# Patient Record
Sex: Male | Born: 1965 | Race: White | Hispanic: No | Smoking: Smoker, current status unknown
Health system: Southern US, Community
[De-identification: ages and names within clinical notes are randomized; demographics above are authoritative.]

## PROBLEM LIST (undated history)

## (undated) DIAGNOSIS — Z9079 Acquired absence of other genital organ(s): Secondary | ICD-10-CM

## (undated) DIAGNOSIS — I2510 Atherosclerotic heart disease of native coronary artery without angina pectoris: Secondary | ICD-10-CM

## (undated) DIAGNOSIS — N189 Chronic kidney disease, unspecified: Secondary | ICD-10-CM

## (undated) DIAGNOSIS — I10 Essential (primary) hypertension: Secondary | ICD-10-CM

## (undated) DIAGNOSIS — I700 Atherosclerosis of aorta: Secondary | ICD-10-CM

## (undated) DIAGNOSIS — C61 Malignant neoplasm of prostate: Secondary | ICD-10-CM

## (undated) DIAGNOSIS — Z8546 Personal history of malignant neoplasm of prostate: Secondary | ICD-10-CM

## (undated) DIAGNOSIS — C801 Malignant (primary) neoplasm, unspecified: Secondary | ICD-10-CM

## (undated) DIAGNOSIS — E291 Testicular hypofunction: Secondary | ICD-10-CM

## (undated) DIAGNOSIS — E785 Hyperlipidemia, unspecified: Secondary | ICD-10-CM

## (undated) DIAGNOSIS — G5600 Carpal tunnel syndrome, unspecified upper limb: Secondary | ICD-10-CM

## (undated) HISTORY — DX: Hyperlipidemia, unspecified: E78.5

## (undated) HISTORY — DX: Chronic kidney disease, unspecified: N18.9

## (undated) HISTORY — PX: FRACTURE SURGERY: SHX138

## (undated) HISTORY — DX: Personal history of malignant neoplasm of prostate: Z85.46

## (undated) HISTORY — DX: Atherosclerosis of aorta: I70.0

## (undated) HISTORY — DX: Carpal tunnel syndrome, unspecified upper limb: G56.00

## (undated) HISTORY — DX: Atherosclerotic heart disease of native coronary artery without angina pectoris: I25.10

## (undated) HISTORY — DX: Acquired absence of other genital organ(s): Z90.79

## (undated) HISTORY — DX: Malignant neoplasm of prostate: C61

## (undated) HISTORY — DX: Testicular hypofunction: E29.1

## (undated) HISTORY — DX: Essential (primary) hypertension: I10

## (undated) HISTORY — PX: VASECTOMY: SHX75

---

## 2008-12-31 ENCOUNTER — Encounter: Admission: RE | Admit: 2008-12-31 | Discharge: 2008-12-31 | Payer: Self-pay | Admitting: Orthopedic Surgery

## 2011-12-14 ENCOUNTER — Ambulatory Visit (INDEPENDENT_AMBULATORY_CARE_PROVIDER_SITE_OTHER): Payer: Managed Care, Other (non HMO) | Admitting: Emergency Medicine

## 2011-12-14 VITALS — BP 126/76 | HR 56 | Temp 97.6°F | Resp 16 | Ht 71.5 in | Wt 218.0 lb

## 2011-12-14 DIAGNOSIS — N400 Enlarged prostate without lower urinary tract symptoms: Secondary | ICD-10-CM

## 2011-12-14 DIAGNOSIS — N402 Nodular prostate without lower urinary tract symptoms: Secondary | ICD-10-CM

## 2011-12-14 DIAGNOSIS — Z0000 Encounter for general adult medical examination without abnormal findings: Secondary | ICD-10-CM

## 2011-12-14 LAB — POCT CBC
Granulocyte percent: 63.5 %G (ref 37–80)
MCH, POC: 30.2 pg (ref 27–31.2)
POC LYMPH PERCENT: 30.4 %L (ref 10–50)
RBC: 5.13 M/uL (ref 4.69–6.13)
RDW, POC: 14 %

## 2011-12-14 LAB — COMPREHENSIVE METABOLIC PANEL
ALT: 32 U/L (ref 0–53)
Albumin: 4.6 g/dL (ref 3.5–5.2)
CO2: 27 mEq/L (ref 19–32)
Chloride: 104 mEq/L (ref 96–112)
Creat: 1.11 mg/dL (ref 0.50–1.35)
Glucose, Bld: 89 mg/dL (ref 70–99)
Potassium: 4.3 mEq/L (ref 3.5–5.3)
Sodium: 140 mEq/L (ref 135–145)
Total Protein: 7.4 g/dL (ref 6.0–8.3)

## 2011-12-14 LAB — POCT UA - MICROSCOPIC ONLY
Casts, Ur, LPF, POC: NEGATIVE
Crystals, Ur, HPF, POC: NEGATIVE
RBC, urine, microscopic: NEGATIVE
WBC, Ur, HPF, POC: NEGATIVE
Yeast, UA: NEGATIVE

## 2011-12-14 LAB — POCT URINALYSIS DIPSTICK
Blood, UA: NEGATIVE
Nitrite, UA: NEGATIVE
Spec Grav, UA: 1.015
Urobilinogen, UA: 0.2
pH, UA: 7

## 2011-12-14 LAB — LIPID PANEL
Triglycerides: 62 mg/dL (ref <150)
VLDL: 12 mg/dL (ref 0–40)

## 2011-12-14 LAB — TSH: TSH: 1.202 u[IU]/mL (ref 0.350–4.500)

## 2011-12-14 NOTE — Progress Notes (Signed)
@[email or web address] @  Patient ID: [name] MRN: [id number], DOB: 1965-04-03 46 y.o. Date of Encounter: 12/14/2011, 9:00 AM  Primary Physician: No primary provider on file.  Chief Complaint: Physical (CPE)  HPI: 46 y.o. y/o male with history noted below here for CPE.  Doing well. No issues/complaints.  Review of Systems:  Consitutional: No fever, chills, fatigue, night sweats, lymphadenopathy, or weight changes. Eyes: No visual changes, eye redness, or discharge. ENT/Mouth: Ears: No otalgia, tinnitus, hearing loss, discharge. Nose: No congestion, rhinorrhea, or epistaxis. Pt does report Sinus Pressure. Throat: No sore throat, post nasal drip, or teeth pain. Cardiovascular: No CP, palpitations, diaphoresis, DOE, edema, orthopnea, PND. Respiratory: No cough, hemoptysis, SOB, or wheezing. Gastrointestinal: No anorexia, dysphagia, reflux, pain, nausea, vomiting, hematemesis, diarrhea, constipation, BRBPR, or melena. Genitourinary: No dysuria, frequency, urgency, hematuria, incontinence, nocturia, decreased urinary stream, discharge, impotence, or testicular pain/masses. Musculoskeletal: No decreased ROM, myalgias, stiffness, joint swelling, or weakness. Skin: No rash, erythema, lesion changes, pain, warmth, jaundice, or pruritis. Neurological: No headache, dizziness, syncope, seizures, tremors, memory loss, coordination problems, or paresthesias. Psychological: No anxiety, depression, hallucinations, SI/HI. Endocrine: No fatigue, polydipsia, polyphagia, polyuria, or known diabetes. All other systems were reviewed and are otherwise negative.  No past medical history on file.   No past surgical history on file.  Home Meds:  Prior to Admission medications   Medication Sig Start Date End Date Taking? Authorizing Provider  Ascorbic Acid (VITAMIN C PO) Take by mouth.   Yes Historical Provider, MD  Cyanocobalamin (VITAMIN B-12 PO) Take by mouth.   Yes Historical Provider, MD  Multiple Vitamin  (MULTIVITAMIN) tablet Take 1 tablet by mouth daily.   Yes Historical Provider, MD    Allergies: No Known Allergies  History   Social History  . Marital Status: Married    Spouse Name: N/A    Number of Children: N/A  . Years of Education: N/A   Occupational History  . Not on file.   Social History Main Topics  . Smoking status: Former Smoker    Types: Cigars  . Smokeless tobacco: Not on file  . Alcohol Use: Not on file  . Drug Use: Not on file  . Sexually Active: Not on file   Other Topics Concern  . Not on file   Social History Narrative  . No narrative on file    No family history on file.  Physical Exam:  Blood pressure 126/76, pulse 56, temperature 97.6 F (36.4 C), temperature source Oral, resp. rate 16, height 5' 11.5" (1.816 m), weight 218 lb (98.884 kg), SpO2 99.00%.  General: Well developed, well nourished, in no acute distress. HEENT: Normocephalic, atraumatic. Conjunctiva pink, sclera non-icteric. Pupils 2 mm constricting to 1 mm, round, regular, and equally reactive to light and accomodation. EOMI. Internal auditory canal clear. TMs with good cone of light and without pathology. Nasal mucosa pink. Nares are without discharge. No sinus tenderness. Oral mucosa pink. Dentition . Pharynx without exudate.   Neck: Supple. Trachea midline. No thyromegaly. Full ROM. No lymphadenopathy. Lungs: Clear to auscultation bilaterally without wheezes, rales, or rhonchi. Breathing is of normal effort and unlabored. Cardiovascular: RRR with S1 S2. No murmurs, rubs, or gallops appreciated. Distal pulses 2+ symmetrically. No carotid or abdominal bruits.  Abdomen: Soft, non-tender, non-distended with normoactive bowel sounds. No hepatosplenomegaly or masses. No rebound/guarding. No CVA tenderness. Without hernias.  Rectal: No external hemorrhoids or fissures. Rectal vault without masses. The left lobe of the prostate is firm and hard .   Genitourinary:  circumcised male. No  penile lesions. Testes descended bilaterally, and smooth without tenderness or masses.  Musculoskeletal: Full range of motion and 5/5 strength throughout. Without swelling, atrophy, tenderness, crepitus, or warmth. Extremities without clubbing, cyanosis, or edema. Calves supple. Skin: Warm and moist without erythema, ecchymosis, wounds, or rash. Neuro: A+Ox3. CN II-XII grossly intact. Moves all extremities spontaneously. Full sensation throughout. Normal gait. DTR 2+ throughout upper and lower extremities. Finger to nose intact. Psych:  Responds to questions appropriately with a normal affect.   Studies: CBC, CMET, Lipid, PSA, TSH,   all pending. Patient is in good health. We'll get further followup of his abnormal prostate exam      Assessment/Plan:  46 y.o. y/o   male here for CPE the only abnormal finding is an enlarged firm area left lobe laterally of the prostate gland PSA was done with get urological evaluation of this area  -  Signed, Earl Lites, MD 12/14/2011 9:00 AM

## 2011-12-14 NOTE — Progress Notes (Deleted)
  Subjective:    Patient ID: [name], male    DOB: 17-Feb-1966, 46 y.o.   MRN: [id number]  HPI    Review of Systems     Objective:   Physical Exam        Assessment & Plan:

## 2012-03-14 ENCOUNTER — Other Ambulatory Visit: Payer: Self-pay | Admitting: Urology

## 2012-03-21 ENCOUNTER — Encounter (HOSPITAL_COMMUNITY): Payer: Self-pay | Admitting: Pharmacy Technician

## 2012-03-22 ENCOUNTER — Encounter (HOSPITAL_COMMUNITY)
Admission: RE | Admit: 2012-03-22 | Discharge: 2012-03-22 | Disposition: A | Discharge status: Home / Self Care | Payer: Managed Care, Other (non HMO) | Source: Ambulatory Visit | Attending: Urology | Admitting: Urology

## 2012-03-22 ENCOUNTER — Ambulatory Visit (HOSPITAL_COMMUNITY)
Admission: RE | Admit: 2012-03-22 | Discharge: 2012-03-22 | Disposition: A | Discharge status: Home / Self Care | Payer: Managed Care, Other (non HMO) | Source: Ambulatory Visit | Attending: Urology | Admitting: Urology

## 2012-03-22 ENCOUNTER — Encounter (HOSPITAL_COMMUNITY): Payer: Self-pay

## 2012-03-22 DIAGNOSIS — Z01812 Encounter for preprocedural laboratory examination: Secondary | ICD-10-CM | POA: Insufficient documentation

## 2012-03-22 DIAGNOSIS — C61 Malignant neoplasm of prostate: Secondary | ICD-10-CM | POA: Insufficient documentation

## 2012-03-22 HISTORY — DX: Malignant (primary) neoplasm, unspecified: C80.1

## 2012-03-22 LAB — SURGICAL PCR SCREEN
MRSA, PCR: NEGATIVE
Staphylococcus aureus: POSITIVE — AB

## 2012-03-22 LAB — CBC
HCT: 43.2 % (ref 39.0–52.0)
Hemoglobin: 14.7 g/dL (ref 13.0–17.0)
MCH: 30.6 pg (ref 26.0–34.0)
MCHC: 34 g/dL (ref 30.0–36.0)
RBC: 4.81 MIL/uL (ref 4.22–5.81)

## 2012-03-22 LAB — BASIC METABOLIC PANEL
CO2: 26 mEq/L (ref 19–32)
GFR calc Af Amer: >90 mL/min (ref >90)
Glucose, Bld: 95 mg/dL (ref 70–99)
Potassium: 3.7 mEq/L (ref 3.5–5.1)

## 2012-03-22 NOTE — Pre-Procedure Instructions (Addendum)
03-22-12 EKG 10'13/ CXR done today. 03-25-12 0920 Pt. Notified of Positive Staph aureus PCR screen- will use Mupirocin as directed.W. Kennon Portela

## 2012-03-22 NOTE — Patient Instructions (Addendum)
20 [name]  03/22/2012   Your procedure is scheduled on:2-10   -2014  Report to [facility] at    0515    AM. Call this number if you have problems the morning of surgery: [phone]  Or Presurgical Testing [phone]([place])   Remember: Follow any bowel prep instructions per MD office.(Follow Clear Liquid diet x 24 hours day before and bowel prep instructions-drink plentiful). For Cpap use: Bring mask and tubing only.   Do not eat food:After Midnight.      Take these medicines the morning of surgery with A SIP OF WATER:none    Do not wear jewelry, make-up or nail polish.  Do not wear lotions, powders, or perfumes. You may wear deodorant.  Do not shave 12 hours prior to first CHG shower(legs and under arms).(face and neck okay.)  Do not bring valuables to the hospital.  Contacts, dentures or bridgework,body piercing,  may not be worn into surgery.  Leave suitcase in the car. After surgery it may be brought to your room.  For patients admitted to the hospital, checkout time is 11:00 AM the day of discharge.   Patients discharged the day of surgery will not be allowed to drive home. Must have responsible person with you x 24 hours once discharged.  Name and phone number of your driver: [name] [phone] cell  Special Instructions: CHG Shower Use Special Wash: see special instructions.(avoid face and genitals)   Please read over the following fact sheets that you were given: MRSA Information, Blood Transfusion fact sheet, Incentive Spirometry Instruction.    Failure to follow these instructions may result in Cancellation of your surgery.   Patient signature_______________________________________________________

## 2012-04-02 ENCOUNTER — Other Ambulatory Visit: Payer: Self-pay | Admitting: Urology

## 2012-04-02 DIAGNOSIS — C61 Malignant neoplasm of prostate: Secondary | ICD-10-CM

## 2012-04-09 ENCOUNTER — Ambulatory Visit (HOSPITAL_COMMUNITY): Payer: Managed Care, Other (non HMO)

## 2012-04-15 ENCOUNTER — Observation Stay (HOSPITAL_COMMUNITY)
Admission: RE | Admit: 2012-04-15 | Discharge: 2012-04-16 | Disposition: A | Discharge status: Home / Self Care | Payer: Managed Care, Other (non HMO) | Source: Ambulatory Visit | Attending: Urology | Admitting: Urology

## 2012-04-15 ENCOUNTER — Ambulatory Visit (HOSPITAL_COMMUNITY): Payer: Managed Care, Other (non HMO) | Admitting: Certified Registered Nurse Anesthetist

## 2012-04-15 ENCOUNTER — Encounter (HOSPITAL_COMMUNITY)
Admission: RE | Disposition: A | Discharge status: Home / Self Care | Payer: Self-pay | Source: Ambulatory Visit | Attending: Urology

## 2012-04-15 ENCOUNTER — Encounter (HOSPITAL_COMMUNITY): Payer: Self-pay | Admitting: *Deleted

## 2012-04-15 ENCOUNTER — Encounter (HOSPITAL_COMMUNITY): Payer: Self-pay | Admitting: Certified Registered Nurse Anesthetist

## 2012-04-15 DIAGNOSIS — C61 Malignant neoplasm of prostate: Principal | ICD-10-CM | POA: Insufficient documentation

## 2012-04-15 HISTORY — PX: ROBOT ASSISTED LAPAROSCOPIC RADICAL PROSTATECTOMY: SHX5141

## 2012-04-15 HISTORY — PX: LYMPHADENECTOMY: SHX5960

## 2012-04-15 LAB — TYPE AND SCREEN: ABO/RH(D): A POS

## 2012-04-15 LAB — HEMOGLOBIN AND HEMATOCRIT, BLOOD: Hemoglobin: 13.7 g/dL (ref 13.0–17.0)

## 2012-04-15 LAB — ABO/RH: ABO/RH(D): A POS

## 2012-04-15 SURGERY — ROBOTIC ASSISTED LAPAROSCOPIC RADICAL PROSTATECTOMY LEVEL 2
Anesthesia: General | Wound class: Clean Contaminated

## 2012-04-15 MED ORDER — DOCUSATE SODIUM 100 MG PO CAPS
100.0000 mg | ORAL_CAPSULE | Freq: Two times a day (BID) | ORAL | Status: DC
  Administered 2012-04-15 – 2012-04-16 (×3): 100 mg via ORAL
  Filled 2012-04-15 (×4): qty 1

## 2012-04-15 MED ORDER — SODIUM CHLORIDE 0.9 % IV BOLUS (SEPSIS)
1000.0000 mL | Freq: Once | INTRAVENOUS | Status: AC
  Administered 2012-04-15: 1000 mL via INTRAVENOUS

## 2012-04-15 MED ORDER — LACTATED RINGERS IV SOLN
INTRAVENOUS | Status: DC | PRN
  Administered 2012-04-15: 11:00:00
  Administered 2012-04-15: 07:00:00 via INTRAVENOUS

## 2012-04-15 MED ORDER — HYDROMORPHONE HCL PF 1 MG/ML IJ SOLN
INTRAMUSCULAR | Status: AC
  Filled 2012-04-15: qty 1

## 2012-04-15 MED ORDER — ACETAMINOPHEN 325 MG PO TABS: 650.0000 mg | ORAL_TABLET | ORAL | Status: DC | PRN

## 2012-04-15 MED ORDER — BUPIVACAINE-EPINEPHRINE PF 0.25-1:200000 % IJ SOLN
INTRAMUSCULAR | Status: AC
  Filled 2012-04-15: qty 90

## 2012-04-15 MED ORDER — NEOSTIGMINE METHYLSULFATE 1 MG/ML IJ SOLN
INTRAMUSCULAR | Status: DC | PRN
  Administered 2012-04-15: 5 mg via INTRAVENOUS

## 2012-04-15 MED ORDER — LIDOCAINE HCL (CARDIAC) 20 MG/ML IV SOLN
INTRAVENOUS | Status: DC | PRN
  Administered 2012-04-15: 100 mg via INTRAVENOUS

## 2012-04-15 MED ORDER — HYDROMORPHONE HCL PF 1 MG/ML IJ SOLN
0.2500 mg | INTRAMUSCULAR | Status: DC | PRN
  Administered 2012-04-15 (×4): 0.5 mg via INTRAVENOUS

## 2012-04-15 MED ORDER — INDIGOTINDISULFONATE SODIUM 8 MG/ML IJ SOLN
INTRAMUSCULAR | Status: DC | PRN
  Administered 2012-04-15 (×2): 5 mL via INTRAVENOUS

## 2012-04-15 MED ORDER — ACETAMINOPHEN 10 MG/ML IV SOLN
INTRAVENOUS | Status: DC | PRN
  Administered 2012-04-15: 1000 mg via INTRAVENOUS

## 2012-04-15 MED ORDER — MIDAZOLAM HCL 5 MG/5ML IJ SOLN
INTRAMUSCULAR | Status: DC | PRN
  Administered 2012-04-15: 2 mg via INTRAVENOUS

## 2012-04-15 MED ORDER — OXYCODONE HCL 5 MG PO TABS: 5.0000 mg | ORAL_TABLET | Freq: Once | ORAL | Status: DC | PRN

## 2012-04-15 MED ORDER — FENTANYL CITRATE 0.05 MG/ML IJ SOLN
INTRAMUSCULAR | Status: DC | PRN
  Administered 2012-04-15 (×5): 50 ug via INTRAVENOUS

## 2012-04-15 MED ORDER — DIPHENHYDRAMINE HCL 12.5 MG/5ML PO ELIX: 12.5000 mg | ORAL_SOLUTION | Freq: Four times a day (QID) | ORAL | Status: DC | PRN

## 2012-04-15 MED ORDER — PROMETHAZINE HCL 25 MG/ML IJ SOLN: 6.2500 mg | INTRAMUSCULAR | Status: DC | PRN

## 2012-04-15 MED ORDER — EPHEDRINE SULFATE 50 MG/ML IJ SOLN
INTRAMUSCULAR | Status: DC | PRN
  Administered 2012-04-15 (×2): 10 mg via INTRAVENOUS

## 2012-04-15 MED ORDER — KETOROLAC TROMETHAMINE 15 MG/ML IJ SOLN
15.0000 mg | Freq: Four times a day (QID) | INTRAMUSCULAR | Status: DC
  Administered 2012-04-15 – 2012-04-16 (×4): 15 mg via INTRAVENOUS
  Filled 2012-04-15 (×5): qty 1

## 2012-04-15 MED ORDER — CIPROFLOXACIN HCL 500 MG PO TABS: 500.0000 mg | ORAL_TABLET | Freq: Two times a day (BID) | ORAL | Status: DC

## 2012-04-15 MED ORDER — CEFAZOLIN SODIUM 1-5 GM-% IV SOLN
1.0000 g | Freq: Three times a day (TID) | INTRAVENOUS | Status: AC
Start: 2012-04-15 — End: 2012-04-15
  Administered 2012-04-15 (×2): 1 g via INTRAVENOUS
  Filled 2012-04-15 (×3): qty 50

## 2012-04-15 MED ORDER — PHENOL 1.4 % MT LIQD
1.0000 | OROMUCOSAL | Status: DC | PRN
  Filled 2012-04-15: qty 177

## 2012-04-15 MED ORDER — HYDROMORPHONE HCL PF 1 MG/ML IJ SOLN
INTRAMUSCULAR | Status: DC | PRN
  Administered 2012-04-15 (×3): 0.5 mg via INTRAVENOUS

## 2012-04-15 MED ORDER — OXYCODONE HCL 5 MG/5ML PO SOLN
5.0000 mg | Freq: Once | ORAL | Status: DC | PRN
  Filled 2012-04-15: qty 5

## 2012-04-15 MED ORDER — MENTHOL 3 MG MT LOZG
1.0000 | LOZENGE | OROMUCOSAL | Status: DC | PRN
  Filled 2012-04-15: qty 9

## 2012-04-15 MED ORDER — BELLADONNA ALKALOIDS-OPIUM 16.2-60 MG RE SUPP
RECTAL | Status: AC
  Filled 2012-04-15: qty 1

## 2012-04-15 MED ORDER — BUPIVACAINE-EPINEPHRINE 0.25% -1:200000 IJ SOLN
INTRAMUSCULAR | Status: DC | PRN
  Administered 2012-04-15: 30 mL

## 2012-04-15 MED ORDER — HYDROCODONE-ACETAMINOPHEN 5-325 MG PO TABS: 1.0000 | ORAL_TABLET | Freq: Four times a day (QID) | ORAL | Status: DC | PRN

## 2012-04-15 MED ORDER — MORPHINE SULFATE 2 MG/ML IJ SOLN
2.0000 mg | INTRAMUSCULAR | Status: DC | PRN
  Filled 2012-04-15: qty 1

## 2012-04-15 MED ORDER — ACETAMINOPHEN 10 MG/ML IV SOLN
INTRAVENOUS | Status: AC
  Filled 2012-04-15: qty 100

## 2012-04-15 MED ORDER — DEXAMETHASONE SODIUM PHOSPHATE 10 MG/ML IJ SOLN
INTRAMUSCULAR | Status: DC | PRN
  Administered 2012-04-15: 10 mg via INTRAVENOUS

## 2012-04-15 MED ORDER — LACTATED RINGERS IV SOLN
INTRAVENOUS | Status: DC | PRN
  Administered 2012-04-15: 08:00:00

## 2012-04-15 MED ORDER — DIPHENHYDRAMINE HCL 50 MG/ML IJ SOLN: 12.5000 mg | Freq: Four times a day (QID) | INTRAMUSCULAR | Status: DC | PRN

## 2012-04-15 MED ORDER — ONDANSETRON HCL 4 MG/2ML IJ SOLN
INTRAMUSCULAR | Status: DC | PRN
  Administered 2012-04-15: 4 mg via INTRAVENOUS

## 2012-04-15 MED ORDER — KCL IN DEXTROSE-NACL 20-5-0.45 MEQ/L-%-% IV SOLN
INTRAVENOUS | Status: DC
  Administered 2012-04-15: 15:00:00 via INTRAVENOUS
  Filled 2012-04-15 (×5): qty 1000

## 2012-04-15 MED ORDER — SUCCINYLCHOLINE CHLORIDE 20 MG/ML IJ SOLN
INTRAMUSCULAR | Status: DC | PRN
  Administered 2012-04-15: 100 mg via INTRAVENOUS

## 2012-04-15 MED ORDER — BELLADONNA ALKALOIDS-OPIUM 16.2-60 MG RE SUPP
1.0000 | Freq: Once | RECTAL | Status: AC
  Administered 2012-04-15: 1 via RECTAL

## 2012-04-15 MED ORDER — STERILE WATER FOR IRRIGATION IR SOLN
Status: DC | PRN
  Administered 2012-04-15: 3000 mL

## 2012-04-15 MED ORDER — HEPARIN SODIUM (PORCINE) 1000 UNIT/ML IJ SOLN
INTRAMUSCULAR | Status: AC
  Filled 2012-04-15: qty 1

## 2012-04-15 MED ORDER — SODIUM CHLORIDE 0.9 % IR SOLN
Status: DC | PRN
  Administered 2012-04-15: 1000 mL

## 2012-04-15 MED ORDER — MEPERIDINE HCL 50 MG/ML IJ SOLN: 6.2500 mg | INTRAMUSCULAR | Status: DC | PRN

## 2012-04-15 MED ORDER — PROPOFOL 10 MG/ML IV BOLUS
INTRAVENOUS | Status: DC | PRN
  Administered 2012-04-15: 200 mg via INTRAVENOUS

## 2012-04-15 MED ORDER — CEFAZOLIN SODIUM-DEXTROSE 2-3 GM-% IV SOLR
2.0000 g | INTRAVENOUS | Status: AC
  Administered 2012-04-15: 2 g via INTRAVENOUS

## 2012-04-15 MED ORDER — ACETAMINOPHEN 10 MG/ML IV SOLN: 1000.0000 mg | Freq: Once | INTRAVENOUS | Status: DC | PRN

## 2012-04-15 MED ORDER — ROCURONIUM BROMIDE 100 MG/10ML IV SOLN
INTRAVENOUS | Status: DC | PRN
  Administered 2012-04-15: 10 mg via INTRAVENOUS
  Administered 2012-04-15: 40 mg via INTRAVENOUS
  Administered 2012-04-15 (×3): 10 mg via INTRAVENOUS

## 2012-04-15 MED ORDER — GLYCOPYRROLATE 0.2 MG/ML IJ SOLN
INTRAMUSCULAR | Status: DC | PRN
  Administered 2012-04-15: 0.6 mg via INTRAVENOUS

## 2012-04-15 MED ORDER — CEFAZOLIN SODIUM-DEXTROSE 2-3 GM-% IV SOLR
INTRAVENOUS | Status: AC
  Filled 2012-04-15: qty 50

## 2012-04-15 SURGICAL SUPPLY — 45 items
CANISTER SUCTION 2500CC (MISCELLANEOUS) ×3 IMPLANT
CATH FOLEY 2WAY SLVR 18FR 30CC (CATHETERS) ×3 IMPLANT
CATH ROBINSON RED A/P 16FR (CATHETERS) ×3 IMPLANT
CATH ROBINSON RED A/P 8FR (CATHETERS) ×3 IMPLANT
CATH TIEMANN FOLEY 18FR 5CC (CATHETERS) ×3 IMPLANT
CHLORAPREP W/TINT 26ML (MISCELLANEOUS) ×3 IMPLANT
CLIP LIGATING HEM O LOK PURPLE (MISCELLANEOUS) ×6 IMPLANT
CLOTH BEACON ORANGE TIMEOUT ST (SAFETY) ×3 IMPLANT
CORD HIGH FREQUENCY UNIPOLAR (ELECTROSURGICAL) ×3 IMPLANT
COVER SURGICAL LIGHT HANDLE (MISCELLANEOUS) ×3 IMPLANT
COVER TIP SHEARS 8 DVNC (MISCELLANEOUS) ×2 IMPLANT
COVER TIP SHEARS 8MM DA VINCI (MISCELLANEOUS) ×1
CUTTER ECHEON FLEX ENDO 45 340 (ENDOMECHANICALS) ×3 IMPLANT
DECANTER SPIKE VIAL GLASS SM (MISCELLANEOUS) IMPLANT
DRAPE SURG IRRIG POUCH 19X23 (DRAPES) ×3 IMPLANT
DRSG TEGADERM 2-3/8X2-3/4 SM (GAUZE/BANDAGES/DRESSINGS) ×9 IMPLANT
DRSG TEGADERM 4X4.75 (GAUZE/BANDAGES/DRESSINGS) ×6 IMPLANT
DRSG TEGADERM 6X8 (GAUZE/BANDAGES/DRESSINGS) IMPLANT
ELECT REM PT RETURN 9FT ADLT (ELECTROSURGICAL) ×3
ELECTRODE REM PT RTRN 9FT ADLT (ELECTROSURGICAL) ×2 IMPLANT
GAUZE SPONGE 2X2 8PLY STRL LF (GAUZE/BANDAGES/DRESSINGS) IMPLANT
GLOVE BIO SURGEON STRL SZ 6.5 (GLOVE) ×3 IMPLANT
GLOVE BIOGEL M STRL SZ7.5 (GLOVE) ×18 IMPLANT
GOWN STRL NON-REIN LRG LVL3 (GOWN DISPOSABLE) ×6 IMPLANT
GOWN STRL REIN XL XLG (GOWN DISPOSABLE) ×6 IMPLANT
HOLDER FOLEY CATH W/STRAP (MISCELLANEOUS) ×3 IMPLANT
IV LACTATED RINGERS 1000ML (IV SOLUTION) IMPLANT
KIT ACCESSORY DA VINCI DISP (KITS) ×1
KIT ACCESSORY DVNC DISP (KITS) ×2 IMPLANT
MARKER SKIN DUAL TIP RULER LAB (MISCELLANEOUS) ×3 IMPLANT
NDL SAFETY ECLIPSE 18X1.5 (NEEDLE) ×2 IMPLANT
NEEDLE HYPO 18GX1.5 SHARP (NEEDLE) ×1
PACK ROBOT UROLOGY CUSTOM (CUSTOM PROCEDURE TRAY) ×3 IMPLANT
RELOAD GREEN ECHELON 45 (STAPLE) ×3 IMPLANT
SET TUBE IRRIG SUCTION NO TIP (IRRIGATION / IRRIGATOR) ×3 IMPLANT
SOLUTION ELECTROLUBE (MISCELLANEOUS) ×3 IMPLANT
SPONGE GAUZE 2X2 STER 10/PKG (GAUZE/BANDAGES/DRESSINGS)
SUT ETHILON 3 0 PS 1 (SUTURE) ×3 IMPLANT
SUT VIC AB 3-0 SH 27 (SUTURE) ×1
SUT VIC AB 3-0 SH 27X BRD (SUTURE) ×2 IMPLANT
SUT VICRYL 0 UR6 27IN ABS (SUTURE) ×6 IMPLANT
SYR 27GX1/2 1ML LL SAFETY (SYRINGE) ×3 IMPLANT
TOWEL OR 17X26 10 PK STRL BLUE (TOWEL DISPOSABLE) ×3 IMPLANT
TOWEL OR NON WOVEN STRL DISP B (DISPOSABLE) ×3 IMPLANT
WATER STERILE IRR 1500ML POUR (IV SOLUTION) IMPLANT

## 2012-04-15 NOTE — Interval H&P Note (Signed)
History and Physical Interval Note:  04/15/2012 7:18 AM  [name]  has presented today for surgery, with the diagnosis of PROSTATE CANCER  The various methods of treatment have been discussed with the patient and family. After consideration of risks, benefits and other options for treatment, the patient has consented to  Procedure(s) with comments: ROBOTIC ASSISTED LAPAROSCOPIC RADICAL PROSTATECTOMY LEVEL 2 (N/A) -   LYMPHADENECTOMY (Bilateral) as a surgical intervention .  The patient's history has been reviewed, patient examined, no change in status, stable for surgery.  I have reviewed the patient's chart and labs.  Questions were answered to the patient's satisfaction.     Mirabella Hilario,LES

## 2012-04-15 NOTE — H&P (Signed)
Chief Complaint  Prostate Cancer   Reason For Visit  Reason for consult: To discuss treatment options for prostate cancer and specifically to consider a robotic prostatectomy. Physician requesting consult: Dr. Ihor Gully PCP: Dr. Elgie Congo   History of Present Illness     [name] is a 47 year old gentleman who was noted to have a PSA of 3.8 and a prostate nodule at the left apex prompting a prostate biopsy by Dr. Vernie Ammons on 02/07/12.  This demonstrated Gleason 3+4=7 adenocarcinoma with 6 out of 12 biopsy cores positive for malignancy. He has no family history of prostate cancer.  He is in exceeding good health with no major medical comorbid conditions.  TNM stage: cT2b Nx Mx (L apex) PSA: 3.8 Gleason score: 3+4=7 Biopsy (02/07/12): 6/12 cores positive    Left: L apex (40%, 3+3=6), L lateral mid (15%, 3+3=6), L mid (5%, 3+3=6), L base (40%, 3+4=7)    Right: R apex (60%, 3+3=6), R mid (10%, 3+3=6) Prostate volume: 20.6 cc  Nomogram: OC disease: 76% EPE: 28% SVI: 1% LNI: 2.2% PFS (surgery): 94%, 91%  Urinary function: He has very minimal symptoms including urinary frequency and nocturia which he attributes to his fluid intake. IPSS is 6. He is not bothered by his symptoms. Erectile function: He denies erectile dysfunction. SHIM score is 25.   Past Medical History Problems  1. History of  No Medical Problems  Surgical History Problems  1. History of  Ankle Surgery Left 2. History of  Biopsy Of The Prostate Needle  Current Meds  None   Allergies Medication  1. No Known Drug Allergies  Family History Problems  1. Family history of  Heart Disease V17.49  Social History Problems    Alcohol Use   Cigars (___ A Day) 305.1 2 a week   Marital History - Currently Married Denied    History of  Caffeine Use  Review of Systems Constitutional, skin, eye, otolaryngeal, hematologic/lymphatic, cardiovascular, pulmonary, endocrine, musculoskeletal, gastrointestinal,  neurological and psychiatric system(s) were reviewed and pertinent findings if present are noted.    Vitals Vital Signs [Data Includes: Last 1 Day]  28Jan2014 02:01PM  Blood Pressure: 144 / 82 Heart Rate: 70  Physical Exam Constitutional: Well nourished and well developed . No acute distress.  ENT:. The ears and nose are normal in appearance.  Neck: The appearance of the neck is normal and no neck mass is present.  Pulmonary: No respiratory distress, normal respiratory rhythm and effort and clear bilateral breath sounds.  Cardiovascular: Heart rate and rhythm are normal . No peripheral edema.  Abdomen: The abdomen is soft and nontender. No masses are palpated. No CVA tenderness. No hernias are palpable. No hepatosplenomegaly noted.  Rectal: Rectal exam demonstrates normal sphincter tone, no tenderness and no masses. Prostate size is estimated to be 35 g. He does have a significant nodule at the left apex extending from the mid to apical gland. The prostate is not tender. The left seminal vesicle is nonpalpable. The right seminal vesicle is nonpalpable. The perineum is normal on inspection.  Lymphatics: The femoral and inguinal nodes are not enlarged or tender.  Skin: Normal skin turgor, no visible rash and no visible skin lesions.  Neuro/Psych:. Mood and affect are appropriate.    Results/Data Urine [Data Includes: Last 1 Day]   28Jan2014  COLOR STRAW   APPEARANCE CLEAR   SPECIFIC GRAVITY <1.005   pH 6.5   GLUCOSE NEG mg/dL  BILIRUBIN NEG   KETONE NEG mg/dL  BLOOD NEG   PROTEIN NEG mg/dL  UROBILINOGEN 0.2 mg/dL  NITRITE NEG   LEUKOCYTE ESTERASE NEG     I have reviewed his medical records, PSA results, and pathology report. Findings are as dictated above.   Assessment Assessed  1. Adenocarcinoma Of The Prostate Gland 185  Plan Adenocarcinoma Of The Prostate Gland (185)  1. Diazepam 10 MG Oral Tablet; TAKE 1 TABLET Once Take 1 hour prior to MRI; Therapy:  28Jan2014 to  (Complete:29Jan2014); Last Rx:28Jan2014 2. PT Referral Referral  Referral  Requested for: 06Feb2014 Health Maintenance (V70.0)  3. UA With REFLEX  Done: 28Jan2014 02:04PM  Discussion/Summary  1. Prostate cancer:   The patient was counseled about the natural history of prostate cancer and the standard treatment options that are available for prostate cancer. It was explained to him how his age and life expectancy, clinical stage, Gleason score, and PSA affect his prognosis, the decision to proceed with additional staging studies, as well as how that information influences recommended treatment strategies. We discussed the roles for active surveillance, radiation therapy, surgical therapy, androgen deprivation, as well as ablative therapy options for the treatment of prostate cancer as appropriate to his individual cancer situation. We discussed the risks and benefits of these options with regard to their impact on cancer control and also in terms of potential adverse events, complications, and impact on quiality of life particularly related to urinary, bowel, and sexual function. The patient was encouraged to ask questions throughout the discussion today and all questions were answered to his stated satisfaction. In addition, the patient was provided with and/or directed to appropriate resources and literature for further education about prostate cancer and treatment options.   We discussed surgical therapy for prostate cancer including the different available surgical approaches. We discussed, in detail, the risks and expectations of surgery with regard to cancer control, urinary control, and erectile function as well as the expected postoperative recovery process. Additional risks of surgery including but not limited to bleeding, infection, hernia formation, nerve damage, lymphocele formation, bowel/rectal injury potentially necessitating colostomy, damage to the urinary tract resulting in urine leakage,  urethral stricture, and the cardiopulmonary risks such as myocardial infarction, stroke, death, venothromboembolism, etc. were explained. The risk of open surgical conversion for robotic/laparoscopic prostatectomy was also discussed.   He does wish to pursue surgical treatment of his prostate cancer. I specifically discussed his left apical nodule which does raise concern regarding performance of a nerve sparing procedure on that side. Considering his concern about erectile function, I recommended that he proceed with a prostate MRI for further evaluation to determine whether would be appropriate to consider nerve sparing on the left side of the prostate. He is agreeable to this approach.  CC: Dr. Ihor Gully Dr. Elgie Congo    SignaturesElectronically signed by : Heloise Purpura, M.D.; Apr 02 2012  4:51PM

## 2012-04-15 NOTE — Preoperative (Signed)
Beta Blockers   Reason not to administer Beta Blockers:Not Applicable 

## 2012-04-15 NOTE — Addendum Note (Signed)
Addendum created 04/15/12 1331 by Mechele Dawley, CRNA   Modules edited: Anesthesia Flowsheet

## 2012-04-15 NOTE — Transfer of Care (Signed)
Immediate Anesthesia Transfer of Care Note  Patient: [name]  Procedure(s) Performed: Procedure(s) (LRB): ROBOTIC ASSISTED LAPAROSCOPIC RADICAL PROSTATECTOMY LEVEL 2 (N/A) LYMPHADENECTOMY (Bilateral)  Patient Location: PACU  Anesthesia Type: General  Level of Consciousness: sedated, patient cooperative and responds to stimulaton  Airway & Oxygen Therapy: Patient Spontanous Breathing and Patient connected to face mask oxgen  Post-op Assessment: Report given to PACU RN and Post -op Vital signs reviewed and stable  Post vital signs: Reviewed and stable  Complications: No apparent anesthesia complications

## 2012-04-15 NOTE — Op Note (Signed)
Preoperative diagnosis: Clinically localized adenocarcinoma of the prostate (clinical stage T2b N0 M0)  Postoperative diagnosis: Clinically localized adenocarcinoma of the prostate (clinical stage T2b N0 M0)  Procedure:  1. Robotic assisted laparoscopic radical prostatectomy (partial bilateral nerve sparing) 2. Bilateral robotic assisted laparoscopic pelvic lymphadenectomy  Surgeon: Moody Bruins. M.D.  Assistant: Pecola Leisure, PA-C  Anesthesia: General  Complications: None  EBL: 100 mL  IVF:  1600 mL crystalloid  Specimens: 1. Prostate and seminal vesicles 2. Right pelvic lymph nodes 3. Left pelvic lymph nodes  Disposition of specimens: Pathology  Drains: 1. 20 Fr coude catheter 2. # 19 Blake pelvic drain  Indication: [name] is a 47 y.o. year old patient with clinically localized prostate cancer.  After a thorough review of the management options for treatment of prostate cancer, he elected to proceed with surgical therapy and the above procedure(s).  We have discussed the potential benefits and risks of the procedure, side effects of the proposed treatment, the likelihood of the patient achieving the goals of the procedure, and any potential problems that might occur during the procedure or recuperation. Informed consent has been obtained.  Description of procedure:  The patient was taken to the operating room and a general anesthetic was administered. He was given preoperative antibiotics, placed in the dorsal lithotomy position, and prepped and draped in the usual sterile fashion. Next a preoperative timeout was performed. A urethral catheter was placed into the bladder and a site was selected near the umbilicus for placement of the camera port. This was placed using a standard open Hassan technique which allowed entry into the peritoneal cavity under direct vision and without difficulty. A 12 mm port was placed and a pneumoperitoneum established. The camera  was then used to inspect the abdomen and there was no evidence of any intra-abdominal injuries or other abnormalities. The remaining abdominal ports were then placed. 8 mm robotic ports were placed in the right lower quadrant, left lower quadrant, and far left lateral abdominal wall. A 5 mm port was placed in the right upper quadrant and a 12 mm port was placed in the right lateral abdominal wall for laparoscopic assistance. All ports were placed under direct vision without difficulty. The surgical cart was then docked.   Utilizing the cautery scissors, the bladder was reflected posteriorly allowing entry into the space of Retzius and identification of the endopelvic fascia and prostate. The periprostatic fat was then removed from the prostate allowing full exposure of the endopelvic fascia. The endopelvic fascia was then incised from the apex back to the base of the prostate bilaterally and the underlying levator muscle fibers were swept laterally off the prostate thereby isolating the dorsal venous complex. The dorsal vein was then stapled and divided with a 45 mm Flex Echelon stapler. Attention then turned to the bladder neck which was divided anteriorly thereby allowing entry into the bladder and exposure of the urethral catheter. The catheter balloon was deflated and the catheter was brought into the operative field and used to retract the prostate anteriorly. The posterior bladder neck was then examined and was divided allowing further dissection between the bladder and prostate posteriorly until the vasa deferentia and seminal vessels were identified. The vasa deferentia were isolated, divided, and lifted anteriorly. The seminal vesicles were dissected down to their tips with care to control the seminal vascular arterial blood supply. These structures were then lifted anteriorly and the space between Denonvillier's fascia and the anterior rectum was developed with a combination  of sharp and blunt  dissection. This isolated the vascular pedicles of the prostate.  The lateral prostatic fascia was then sharply incised allowing release of the neurovascular bundles bilaterally. Based on the concern of palpable disease at the left apex and concern about the right side of the prostate on MRI, it was decided to perform a partial bilateral nerve sparing procedure. The vascular pedicles of the prostate were then ligated with Weck clips between the prostate and neurovascular bundles and divided with sharp cold scissor dissection resulting in neurovascular bundle preservation but with at least a few millimeters of periprostatic fatty tissue left on the prostate bilaterally. The neurovascular bundles were then separated off the apex of the prostate and urethra bilaterally again with particular care taken around the left apex where palpable disease had been noted on exam.  The urethra was then sharply transected allowing the prostate specimen to be disarticulated. The pelvis was copiously irrigated and hemostasis was ensured. There was no evidence for rectal injury.  Attention then turned to the right pelvic sidewall. The fibrofatty tissue between the external iliac vein, confluence of the iliac vessels, hypogastric artery, and Cooper's ligament was dissected free from the pelvic sidewall with care to preserve the obturator nerve. Weck clips were used for lymphostasis and hemostasis. An identical procedure was performed on the contralateral side and the lymphatic packets were removed for permanent pathologic analysis.  Attention then turned to the urethral anastomosis. A 2-0 Vicryl slip knot was placed between Denonvillier's fascia, the posterior bladder neck, and the posterior urethra to reapproximate these structures. A double-armed 3-0 Monocryl suture was then used to perform a 360 running tension-free anastomosis between the bladder neck and urethra. A new urethral catheter was then placed into the bladder and  irrigated. There were no blood clots within the bladder and the anastomosis appeared to be watertight. A #19 Blake drain was then brought through the left lateral 8 mm port site and positioned appropriately within the pelvis. It was secured to the skin with a nylon suture. The surgical cart was then undocked. The right lateral 12 mm port site was closed at the fascial level with a 0 Vicryl suture placed laparoscopically. All remaining ports were then removed under direct vision. The prostate specimen was removed intact within the Endopouch retrieval bag via the periumbilical camera port site. This fascial opening was closed with two running 0 Vicryl sutures. 0.25% Marcaine was then injected into all port sites and all incisions were reapproximated at the skin level with staples. Sterile dressings were applied. The patient appeared to tolerate the procedure well and without complications. The patient was able to be extubated and transferred to the recovery unit in satisfactory condition.   Moody Bruins MD

## 2012-04-15 NOTE — Anesthesia Preprocedure Evaluation (Addendum)
Anesthesia Evaluation  Patient identified by MRN, date of birth, ID band Patient awake    Reviewed: Allergy & Precautions, H&P , NPO status , Patient's Chart, lab work & pertinent test results  Airway Mallampati: I TM Distance: >3 FB Neck ROM: Full    Dental  (+) Dental Advisory Given, Missing and Teeth Intact,    Pulmonary former smoker,  breath sounds clear to auscultation  Pulmonary exam normal       Cardiovascular negative cardio ROS  Rhythm:Regular Rate:Normal     Neuro/Psych negative neurological ROS  negative psych ROS   GI/Hepatic negative GI ROS, Neg liver ROS,   Endo/Other  negative endocrine ROS  Renal/GU negative Renal ROS     Musculoskeletal negative musculoskeletal ROS (+)   Abdominal   Peds  Hematology negative hematology ROS (+)   Anesthesia Other Findings   Reproductive/Obstetrics                          Anesthesia Physical Anesthesia Plan  ASA: II  Anesthesia Plan: General   Post-op Pain Management:    Induction: Intravenous  Airway Management Planned: Oral ETT  Additional Equipment:   Intra-op Plan:   Post-operative Plan: Extubation in OR  Informed Consent: I have reviewed the patients History and Physical, chart, labs and discussed the procedure including the risks, benefits and alternatives for the proposed anesthesia with the patient or authorized representative who has indicated his/her understanding and acceptance.   Dental advisory given  Plan Discussed with: CRNA  Anesthesia Plan Comments:         Anesthesia Quick Evaluation

## 2012-04-15 NOTE — Anesthesia Postprocedure Evaluation (Signed)
Anesthesia Post Note  Patient: [name]  Procedure(s) Performed: Procedure(s) (LRB): ROBOTIC ASSISTED LAPAROSCOPIC RADICAL PROSTATECTOMY LEVEL 2 (N/A) LYMPHADENECTOMY (Bilateral)  Anesthesia type: General  Patient location: PACU  Post pain: Pain level controlled  Post assessment: Post-op Vital signs reviewed  Last Vitals: BP 133/68  Pulse 69  Temp(Src) 36.4 C (Oral)  Resp 12  Ht 6\' 1"  (1.854 m)  Wt 233 lb (105.688 kg)  BMI 30.75 kg/m2  SpO2 100%  Post vital signs: Reviewed  Level of consciousness: sedated  Complications: No apparent anesthesia complications

## 2012-04-15 NOTE — Progress Notes (Signed)
Patient ID: [name], male   DOB: [birth year], 47 y.o.   MRN: [id number] Post-op note  Subjective: The patient is doing well.  No complaints. No N/V.  Objective: Vital signs in last 24 hours: Temp:  [94 F (34.4 C)-98.2 F (36.8 C)] 97.5 F (36.4 C) (02/10 1145) Pulse Rate:  [68-79] 69 (02/10 1145) Resp:  [8-18] 12 (02/10 1145) BP: (127-147)/(67-82) 133/68 mmHg (02/10 1145) SpO2:  [100 %] 100 % (02/10 1145) Weight:  [105.688 kg (233 lb)] 105.688 kg (233 lb) (02/10 1145)  Intake/Output from previous day:   Intake/Output this shift: Total I/O In: 3200 [I.V.:2200; IV Piggyback:1000] Out: 350 [Urine:200; Drains:50; Blood:100]  Physical Exam:  General: Alert and oriented. Abdomen: Soft, Nondistended. Incisions: Clean and dry. Urine: reddish blue  Lab Results:  Recent Labs  04/15/12 1031  HGB 13.7  HCT 40.3    Assessment/Plan: POD#0   1) Continue to monitor 2.) DVT prophy, amb, IS, pain control    LOS: 0 days   YARBROUGH,Kelvin Burpee G. 04/15/2012, 2:53 PM

## 2012-04-16 ENCOUNTER — Encounter (HOSPITAL_COMMUNITY): Payer: Self-pay | Admitting: Urology

## 2012-04-16 LAB — GLUCOSE, CAPILLARY: Glucose-Capillary: 77 mg/dL (ref 70–99)

## 2012-04-16 LAB — HEMOGLOBIN AND HEMATOCRIT, BLOOD
HCT: 37.7 % — ABNORMAL LOW (ref 39.0–52.0)
Hemoglobin: 13.1 g/dL (ref 13.0–17.0)

## 2012-04-16 MED ORDER — HYDROCODONE-ACETAMINOPHEN 5-325 MG PO TABS: 1.0000 | ORAL_TABLET | Freq: Four times a day (QID) | ORAL | Status: DC | PRN

## 2012-04-16 MED ORDER — BISACODYL 10 MG RE SUPP
10.0000 mg | Freq: Once | RECTAL | Status: AC
  Administered 2012-04-16: 10 mg via RECTAL
  Filled 2012-04-16: qty 1

## 2012-04-16 NOTE — Progress Notes (Signed)
Patient ID: [name], male   DOB: 1965/11/05, 47 y.o.   MRN: [id number]  1 Day Post-Op Subjective: The patient is doing well.  No nausea or vomiting. Pain is adequately controlled.  Objective: Vital signs in last 24 hours: Temp:  [97.5 F (36.4 C)-98.3 F (36.8 C)] 97.5 F (36.4 C) (02/11 0535) Pulse Rate:  [68-96] 74 (02/11 0535) Resp:  [8-20] 20 (02/11 0535) BP: (101-147)/(67-77) 135/74 mmHg (02/11 0535) SpO2:  [99 %-100 %] 99 % (02/11 0535) Weight:  [105.688 kg (233 lb)] 105.688 kg (233 lb) (02/10 1145)  Intake/Output from previous day: 02/10 0701 - 02/11 0700 In: 4867.5 [P.O.:450; I.V.:3417.5; IV Piggyback:1000] Out: 6720 [Urine:6400; Drains:220; Blood:100] Intake/Output this shift: Total I/O In: 1217.5 [I.V.:1217.5] Out: 5200 [Urine:5125; Drains:75]  Physical Exam:  General: Alert and oriented. CV: RRR Lungs: Clear bilaterally. GI: Soft, Nondistended. Incisions: Dressings intact. Urine: Clear Extremities: Nontender, no erythema, no edema.  Lab Results:  Recent Labs  04/15/12 1031 04/16/12 0440  HGB 13.7 13.1  HCT 40.3 37.7*      Assessment/Plan: POD# 1 s/p robotic prostatectomy.  1) SL IVF 2) Ambulate, Incentive spirometry 3) Transition to oral pain medication 4) Dulcolax suppository 5) D/C pelvic drain 6) Plan for likely discharge later today   Moody Bruins. MD   LOS: 1 day   Helios Kohlmann,LES 04/16/2012, 6:49 AM

## 2012-04-16 NOTE — Discharge Summary (Signed)
  Date of admission: 04/15/2012  Date of discharge: 04/16/2012  Admission diagnosis: Prostate Cancer  Discharge diagnosis: Prostate Cancer  History and Physical: For full details, please see admission history and physical. Briefly, [name] is a 47 y.o. gentleman with localized prostate cancer.  After discussing management/treatment options, he elected to proceed with surgical treatment.  Hospital Course: Traylen Eckels was taken to the operating room on 04/15/2012 and underwent a robotic assisted laparoscopic radical prostatectomy. He tolerated this procedure well and without complications. Postoperatively, he was able to be transferred to a regular hospital room following recovery from anesthesia.  He was able to begin ambulating the night of surgery. He remained hemodynamically stable overnight.  He had excellent urine output with appropriately minimal output from his pelvic drain and his pelvic drain was removed on POD #1.  He was transitioned to oral pain medication, tolerated a clear liquid diet, and had met all discharge criteria and was able to be discharged home later on POD#1.  Laboratory values:  Recent Labs  04/15/12 1031 04/16/12 0440  HGB 13.7 13.1  HCT 40.3 37.7*    Disposition: Home  Discharge instruction: He was instructed to be ambulatory but to refrain from heavy lifting, strenuous activity, or driving. He was instructed on urethral catheter care.  Discharge medications:     Medication List    STOP taking these medications       ibuprofen 200 MG tablet  Commonly known as:  ADVIL,MOTRIN     multivitamin tablet      TAKE these medications       ciprofloxacin 500 MG tablet  Commonly known as:  CIPRO  Take 1 tablet (500 mg total) by mouth 2 (two) times daily. Start day prior to office visit for foley removal     HYDROcodone-acetaminophen 5-325 MG per tablet  Commonly known as:  NORCO  Take 1-2 tablets by mouth every 6 (six) hours as needed for pain.         Followup: He will followup in 1 week for catheter removal and to discuss his surgical pathology results.

## 2012-04-16 NOTE — Progress Notes (Signed)
Pt is to be discharged home today. Pt is in NAD, IV is out, all paperwork has been reviewed/discussed with patient, foley care/leg bag education has been completed, and there are no questions/concerns at this time. Assessment is unchanged from this morning. Pt is to be accompanied downstairs by staff and family via wheelchair.

## 2012-04-16 NOTE — Care Management Note (Unsigned)
    Page 1 of 1   04/16/2012     10:47:13 AM   CARE MANAGEMENT NOTE 04/16/2012  Patient:  [name]   Account Number:  [id number]  Date Initiated:  04/16/2012  Documentation initiated by:  Lanier Clam  Subjective/Objective Assessment:   ADMITTED W/PROSTATE CA.     Action/Plan:   FROM HOME.HAS PCP,PHARMACY.   Anticipated DC Date:  04/16/2012   Anticipated DC Plan:  HOME/SELF CARE      DC Planning Services  CM consult      Choice offered to / List presented to:             Status of service:  Completed, signed off Medicare Important Message given?   (If response is "NO", the following Medicare IM given date fields will be blank) Date Medicare IM given:   Date Additional Medicare IM given:    Discharge Disposition:  HOME/SELF CARE  Per UR Regulation:  Reviewed for med. necessity/level of care/duration of stay  If discussed at Long Length of Stay Meetings, dates discussed:    Comments:  04/16/12 Elias Dennington RN,BSN NCM [phone] POD#1 LAP ASST ROB PROSTATECTOMY,JP,F/C.

## 2012-05-23 ENCOUNTER — Ambulatory Visit: Payer: Managed Care, Other (non HMO) | Attending: Urology | Admitting: Physical Therapy

## 2012-05-23 DIAGNOSIS — IMO0001 Reserved for inherently not codable concepts without codable children: Secondary | ICD-10-CM | POA: Insufficient documentation

## 2012-05-23 DIAGNOSIS — M2420 Disorder of ligament, unspecified site: Secondary | ICD-10-CM | POA: Insufficient documentation

## 2012-05-27 ENCOUNTER — Ambulatory Visit: Payer: Managed Care, Other (non HMO) | Admitting: Physical Therapy

## 2012-06-05 ENCOUNTER — Ambulatory Visit: Payer: Managed Care, Other (non HMO) | Admitting: Physical Therapy

## 2012-06-10 ENCOUNTER — Ambulatory Visit: Payer: Managed Care, Other (non HMO) | Attending: Urology | Admitting: Physical Therapy

## 2012-06-10 DIAGNOSIS — M2420 Disorder of ligament, unspecified site: Secondary | ICD-10-CM | POA: Insufficient documentation

## 2012-06-10 DIAGNOSIS — IMO0001 Reserved for inherently not codable concepts without codable children: Secondary | ICD-10-CM | POA: Insufficient documentation

## 2012-06-10 DIAGNOSIS — M629 Disorder of muscle, unspecified: Secondary | ICD-10-CM | POA: Insufficient documentation

## 2012-06-10 DIAGNOSIS — Z8546 Personal history of malignant neoplasm of prostate: Secondary | ICD-10-CM | POA: Insufficient documentation

## 2012-07-02 ENCOUNTER — Ambulatory Visit: Payer: Managed Care, Other (non HMO) | Admitting: Physical Therapy

## 2012-07-30 ENCOUNTER — Ambulatory Visit: Payer: Managed Care, Other (non HMO) | Attending: Urology | Admitting: Physical Therapy

## 2013-02-24 ENCOUNTER — Ambulatory Visit: Payer: Managed Care, Other (non HMO) | Admitting: Family Medicine

## 2013-02-24 VITALS — BP 116/76 | HR 60 | Temp 98.2°F | Resp 16 | Ht 72.25 in | Wt 233.0 lb

## 2013-02-24 DIAGNOSIS — B079 Viral wart, unspecified: Secondary | ICD-10-CM

## 2013-02-24 DIAGNOSIS — R21 Rash and other nonspecific skin eruption: Secondary | ICD-10-CM

## 2013-02-24 MED ORDER — CLOTRIMAZOLE-BETAMETHASONE 1-0.05 % EX CREA: 1.0000 "application " | TOPICAL_CREAM | Freq: Two times a day (BID) | CUTANEOUS | Status: DC

## 2013-02-24 NOTE — Patient Instructions (Signed)
The warts should peel off in the next couple of weeks. If they come back or do not go away we will need to retreat them  Use the cream twice daily.

## 2013-02-24 NOTE — Progress Notes (Signed)
Subjective: Patient is here for 2 complaints. He's developed a rash on his biceps area near the axilla on both arms over the last couple of weeks, also a little bit under his left breast.  He also has a couple of warts on his left hand and right hand, one each, that he warts taken off. He has had warts in the past, but it is been a while.  A year ago he was diagnosed by Dr. Cleta Alberts and is having a prostate nodule, which turned out to be a prostate cancer, removed with a 0 PSA now.  Objective: 1 wart is on the right hand at the base of the fifth finger, the other on the left hand index finger.  The dermatitis on his left upper arm is more prominent than the other areas, with one portion having any active border appearance. The rest is a little splotchy. There is only a little splotchiness of the right upper arm and a 1 x 2 CM area underneath his left breast. Skin scraping was taken from the left arm  Assessment: Dermatitis, suspicious for tinea Common warts on hands  Plan: Cryosurgery of warts   Results for orders placed in visit on 02/24/13  POCT SKIN KOH      Result Value Range   Skin KOH, POC Negative     Still looks very suspicious for a fungal rash. Will therefore treat with a combination cream.  The cryosurgical spray gun was not available. The warts were treated with liquid nitrogen by using a Q-tip dipped into the liquid nitrogen. Good freeze was obtained and lesions were treated free stall refreeze 3 times.

## 2013-03-16 ENCOUNTER — Ambulatory Visit: Payer: Managed Care, Other (non HMO) | Admitting: Physician Assistant

## 2013-03-16 VITALS — BP 110/70 | HR 87 | Temp 98.1°F | Resp 16 | Ht 72.0 in | Wt 229.0 lb

## 2013-03-16 DIAGNOSIS — L282 Other prurigo: Secondary | ICD-10-CM

## 2013-03-16 DIAGNOSIS — L42 Pityriasis rosea: Secondary | ICD-10-CM

## 2013-03-16 MED ORDER — HYDROXYZINE HCL 25 MG PO TABS: 25.0000 mg | ORAL_TABLET | Freq: Every evening | ORAL | Status: DC | PRN

## 2013-03-16 MED ORDER — METHYLPREDNISOLONE (PAK) 4 MG PO TABS: ORAL_TABLET | ORAL | Status: DC

## 2013-03-16 NOTE — Progress Notes (Signed)
   Subjective:    Patient ID: [name]., male    DOB: [birth year], 48 y.o.   MRN: [id number]  HPI 48 year old male presents for evaluation of rash on arms, torso, and upper part of legs. States it started about 2 weeks ago and has continued to linger on.  Was seen here on 12/22 and diagnosed with possible ringworm. Had patch on left arm and left side of his chest. Placed on lotrisone cream and admits those patches did fade, however now he has erupted in a diffuse, erythematous, slightly pruritic rash.  No new soaps, detergents, medications, or lotions.  Has been using OTC cortisone and a stringent that has been drying his skin.   Patient is otherwise doing well with no other concerns today.     Review of Systems  Constitutional: Negative for fever and chills.  Gastrointestinal: Negative for nausea and vomiting.  Skin: Positive for rash.  Neurological: Negative for headaches.       Objective:   Physical Exam  Constitutional: He appears well-developed and well-nourished.  HENT:  Head: Normocephalic and atraumatic.  Right Ear: External ear normal.  Left Ear: External ear normal.  Eyes: Conjunctivae are normal.  Neck: Normal range of motion.  Cardiovascular: Normal rate.   Pulmonary/Chest: Effort normal.  Skin:  Diffuse, pink, maculopapular rash over bilateral arms and torso.  No vesicles, warmth, pain, or drainage noted.   Psychiatric: He has a normal mood and affect. His behavior is normal. Judgment and thought content normal.          Assessment & Plan:  Pityriasis rosea  Pruritic rash - Plan: methylPREDNIsolone (MEDROL DOSPACK) 4 MG tablet, hydrOXYzine (ATARAX/VISTARIL) 25 MG tablet  Based on hx, likely pityriasis rosea with "ringworm like rash" on 12/22 as herald patch.  Will treat with medrol dose pack and antihistamines to help with itch Zyrtec daily in the morning. Atarax at bedtime Follow up if symptoms worsen or fail to improve.

## 2013-03-16 NOTE — Patient Instructions (Signed)

## 2013-05-10 ENCOUNTER — Ambulatory Visit: Payer: Managed Care, Other (non HMO) | Admitting: Family Medicine

## 2013-05-10 VITALS — BP 120/68 | HR 82 | Temp 98.0°F | Resp 16 | Ht 72.0 in | Wt 226.0 lb

## 2013-05-10 DIAGNOSIS — B354 Tinea corporis: Secondary | ICD-10-CM

## 2013-05-10 DIAGNOSIS — R21 Rash and other nonspecific skin eruption: Secondary | ICD-10-CM

## 2013-05-10 LAB — POCT SKIN KOH: Skin KOH, POC: POSITIVE

## 2013-05-10 MED ORDER — TERBINAFINE HCL 250 MG PO TABS: 250.0000 mg | ORAL_TABLET | Freq: Every day | ORAL | Status: DC

## 2013-05-10 NOTE — Patient Instructions (Addendum)
Take the terbinafine 250 mg one daily for 2 weeks  If symptoms persist please return  Body Ringworm Ringworm (tinea corporis) is a fungal infection of the skin on the body. This infection is not caused by worms, but is actually caused by a fungus. Fungus normally lives on the top of your skin and can be useful. However, in the case of ringworms, the fungus grows out of control and causes a skin infection. It can involve any area of skin on the body and can spread easily from one person to another (contagious). Ringworm is a common problem for children, but it can affect adults as well. Ringworm is also often found in athletes, especially wrestlers who share equipment and mats.  CAUSES  Ringworm of the body is caused by a fungus called dermatophyte. It can spread by:  Touchingother people who are infected.  Touchinginfected pets.  Touching or sharingobjects that have been in contact with the infected person or pet (hats, combs, towels, clothing, sports equipment). SYMPTOMS   Itchy, raised red spots and bumps on the skin.  Ring-shaped rash.  Redness near the border of the rash with a clear center.  Dry and scaly skin on or around the rash. Not every person develops a ring-shaped rash. Some develop only the red, scaly patches. DIAGNOSIS  Most often, ringworm can be diagnosed by performing a skin exam. Your caregiver may choose to take a skin scraping from the affected area. The sample will be examined under the microscope to see if the fungus is present.  TREATMENT  Body ringworm may be treated with a topical antifungal cream or ointment. Sometimes, an antifungal shampoo that can be used on your body is prescribed. You may be prescribed antifungal medicines to take by mouth if your ringworm is severe, keeps coming back, or lasts a long time.  HOME CARE INSTRUCTIONS   Only take over-the-counter or prescription medicines as directed by your caregiver.  Wash the infected area and dry it  completely before applying yourcream or ointment.  When using antifungal shampoo to treat the ringworm, leave the shampoo on the body for 3 5 minutes before rinsing.   Wear loose clothing to stop clothes from rubbing and irritating the rash.  Wash or change your bed sheets every night while you have the rash.  Have your pet treated by your veterinarian if it has the same infection. To prevent ringworm:   Practice good hygiene.  Wear sandals or shoes in public places and showers.  Do not share personal items with others.  Avoid touching red patches of skin on other people.  Avoid touching pets that have bald spots or wash your hands after doing so. SEEK MEDICAL CARE IF:   Your rash continues to spread after 7 days of treatment.  Your rash is not gone in 4 weeks.  The area around your rash becomes red, warm, tender, and swollen. Document Released: 02/18/2000 Document Revised: 11/15/2011 Document Reviewed: 09/04/2011 [facility] Patient Information 2014 [place].

## 2013-05-10 NOTE — Progress Notes (Signed)
Subjective: Patient was seen late last year with a rash that I gave creams for. He had a negative KOH at that time. He is a little bit better, then the rash has recurred in both axilla, also behind his neck, and in the groin mostly on the right side of the scrotum and penis and a little in the pubic hair. This itches some. Dosages concerns him that keeps spreading. 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 Doesn't have an active-appearing bowl, but is a flaky in some areas. Scraping was taken.  Assessment: Dermatitis neck, axilla, and genitalia  We will see what the scraping shows. If it is negative will do a punch biopsy.  Results for orders placed in visit on 05/10/13  POCT SKIN KOH      Result Value Ref Range   Skin KOH, POC Positive     Fungal dermatosis  Terbinafine 250 qd 2 weeks

## 2013-07-14 ENCOUNTER — Ambulatory Visit (INDEPENDENT_AMBULATORY_CARE_PROVIDER_SITE_OTHER): Payer: Managed Care, Other (non HMO) | Admitting: Physician Assistant

## 2013-07-14 VITALS — BP 120/88 | HR 72 | Temp 97.8°F | Resp 20 | Ht 71.0 in | Wt 225.4 lb

## 2013-07-14 DIAGNOSIS — W540XXA Bitten by dog, initial encounter: Principal | ICD-10-CM

## 2013-07-14 DIAGNOSIS — S71152A Open bite, left thigh, initial encounter: Secondary | ICD-10-CM

## 2013-07-14 DIAGNOSIS — S71009A Unspecified open wound, unspecified hip, initial encounter: Secondary | ICD-10-CM

## 2013-07-14 DIAGNOSIS — S71109A Unspecified open wound, unspecified thigh, initial encounter: Secondary | ICD-10-CM

## 2013-07-14 NOTE — Patient Instructions (Signed)
Keep the area clean and dry  If you notice redness, swelling, pain, drainage, fever, etc please call us right away  We have faxed the animal bite report to [facility] - if you do not hear from them in 1 week, please call and make sure they are working on making contact with the animal.  If we do not have vaccination records for the dog by about 10 days, we may want to consider the rabies vaccine which is available through the emergency department   Animal Bite An animal bite can result in a scratch on the skin, deep open cut, puncture of the skin, crush injury, or tearing away of the skin or a body part. Dogs are responsible for most animal bites. Children are bitten more often than adults. An animal bite can range from very mild to more serious. A small bite from your house pet is no cause for alarm. However, some animal bites can become infected or injure a bone or other tissue. You must seek medical care if:  The skin is broken and bleeding does not slow down or stop after 15 minutes.  The puncture is deep and difficult to clean (such as a cat bite).  Pain, warmth, redness, or pus develops around the wound.  The bite is from a stray animal or rodent. There may be a risk of rabies infection.  The bite is from a snake, raccoon, skunk, fox, coyote, or bat. There may be a risk of rabies infection.  The person bitten has a chronic illness such as diabetes, liver disease, or cancer, or the person takes medicine that lowers the immune system.  There is concern about the location and severity of the bite. It is important to clean and protect an animal bite wound right away to prevent infection. Follow these steps:  Clean the wound with plenty of water and soap.  Apply an antibiotic cream.  Apply gentle pressure over the wound with a clean towel or gauze to slow or stop bleeding.  Elevate the affected area above the heart to help stop any bleeding.  Seek medical care. Getting medical  care within 8 hours of the animal bite leads to the best possible outcome. DIAGNOSIS  Your caregiver will most likely:  Take a detailed history of the animal and the bite injury.  Perform a wound exam.  Take your medical history. Blood tests or X-rays may be performed. Sometimes, infected bite wounds are cultured and sent to a lab to identify the infectious bacteria.  TREATMENT  Medical treatment will depend on the location and type of animal bite as well as the patient's medical history. Treatment may include:  Wound care, such as cleaning and flushing the wound with saline solution, bandaging, and elevating the affected area.  Antibiotics.  Tetanus immunization.  Rabies immunization.  Leaving the wound open to heal. This is often done with animal bites, due to the high risk of infection. However, in certain cases, wound closure with stitches, wound adhesive, skin adhesive strips, or staples may be used. Infected bites that are left untreated may require intravenous (IV) antibiotics and surgical treatment in the hospital. [place]  Follow your caregiver's instructions for wound care.  Take all medicines as directed.  If your caregiver prescribes antibiotics, take them as directed. Finish them even if you start to feel better.  Follow up with your caregiver for further exams or immunizations as directed. You may need a tetanus shot if:  You cannot remember  when you had your last tetanus shot.  You have never had a tetanus shot.  The injury broke your skin. If you get a tetanus shot, your arm may swell, get red, and feel warm to the touch. This is common and not a problem. If you need a tetanus shot and you choose not to have one, there is a rare chance of getting tetanus. Sickness from tetanus can be serious. SEEK MEDICAL CARE IF:  You notice warmth, redness, soreness, swelling, pus discharge, or a bad smell coming from the wound.  You have a red line on the  skin coming from the wound.  You have a fever, chills, or a general ill feeling.  You have nausea or vomiting.  You have continued or worsening pain.  You have trouble moving the injured part.  You have other questions or concerns. MAKE SURE YOU:  Understand these instructions.  Will watch your condition.  Will get help right away if you are not doing well or get worse. Document Released: 11/08/2010 Document Revised: 05/15/2011 Document Reviewed: 11/08/2010 [facility] Patient Information 2014 [place].

## 2013-07-14 NOTE — Progress Notes (Signed)
   Subjective:    Patient ID: [name]., male    DOB: 1966-01-14, 48 y.o.   MRN: [id number]  HPI   [name] is a very pleasant 48 yr old male here after suffering a dog bite this afternoon.  He was walking through his neighborhood when a neighbors dog ran out of an open gate and bit him.  The bite was unprovoked.  The bite occurred at his posterior LEFT thigh.  The dog owner reports that the dog is utd on immunizations.  Pt has completed the animal bite form, and it has been faxed to [facility].  Pt reports last tetanus in Oct 2010.  Pt has some soreness at LEFT leg.  Minimal bleeding.  He has not cleaned the area yet.    Review of Systems  Constitutional: Negative for fever and chills.  Respiratory: Negative.   Cardiovascular: Negative.   Musculoskeletal: Positive for arthralgias (left leg).  Skin: Positive for wound.       Objective:   Physical Exam  Vitals reviewed. Constitutional: He is oriented to person, place, and time. He appears well-developed and well-nourished. No distress.  HENT:  Head: Normocephalic and atraumatic.  Cardiovascular: Intact distal pulses.   Pulmonary/Chest: Effort normal.  Neurological: He is alert and oriented to person, place, and time.  Skin: Skin is warm and dry. Abrasion, bruising and laceration noted.     Three wounds at posterior LEFT thigh: superior lesion is bruised and slightly abraded; medially there is a very superficial laceration approx 3cm long surrounded by mild bruising; inferiorly there is a small, round puncture wound that is now scabbed over  Psychiatric: He has a normal mood and affect. His behavior is normal.    Wound Care: Wounds scrubbed with soap and water.  There are no areas requiring repair.  Dressed with non-stick bandages.      Assessment & Plan:  Dog bite of left thigh   [name] is a very pleasant 48 yr old male s/p dog bite to the posterior left thigh.  The wounds were cleansed and dressed.  Tetanus utd.   Animal bite form was faxed to [facility] - pt to call health dept and check on status if he has not heard back in 1 week.  Keep wounds clean dry.  If any evidence of infection pt to call or RTC right away   E. Natividad Brood MHS, PA-C [facility] 5/12/20157:56 AM

## 2013-12-25 IMAGING — CR DG CHEST 2V
2 series · 2 of 2 positions shown · non-contrast
Comparison: None

CLINICAL DATA: Prostate cancer, preoperative assessment

CHEST - 2 VIEW

[w chest pa]
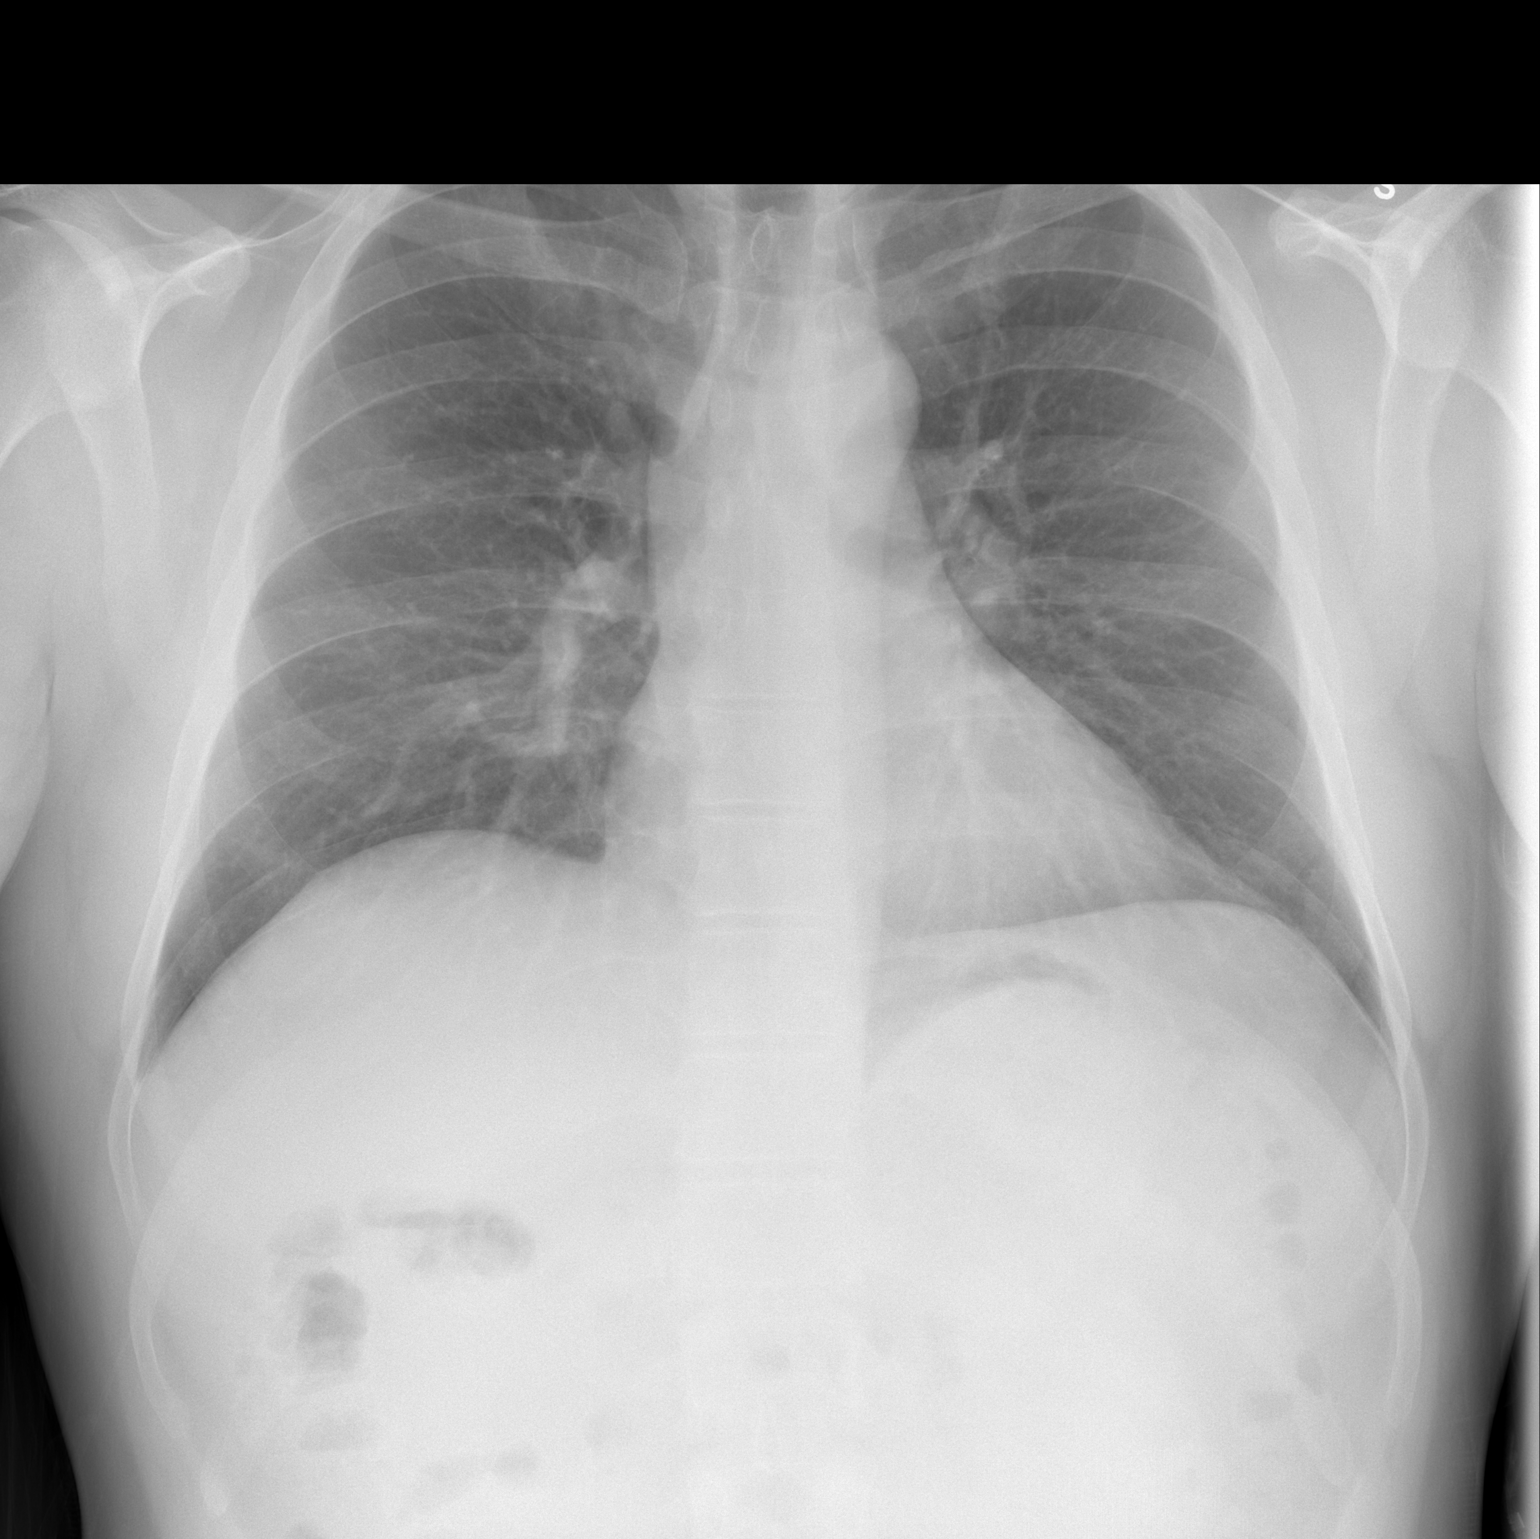

[w chest lat]
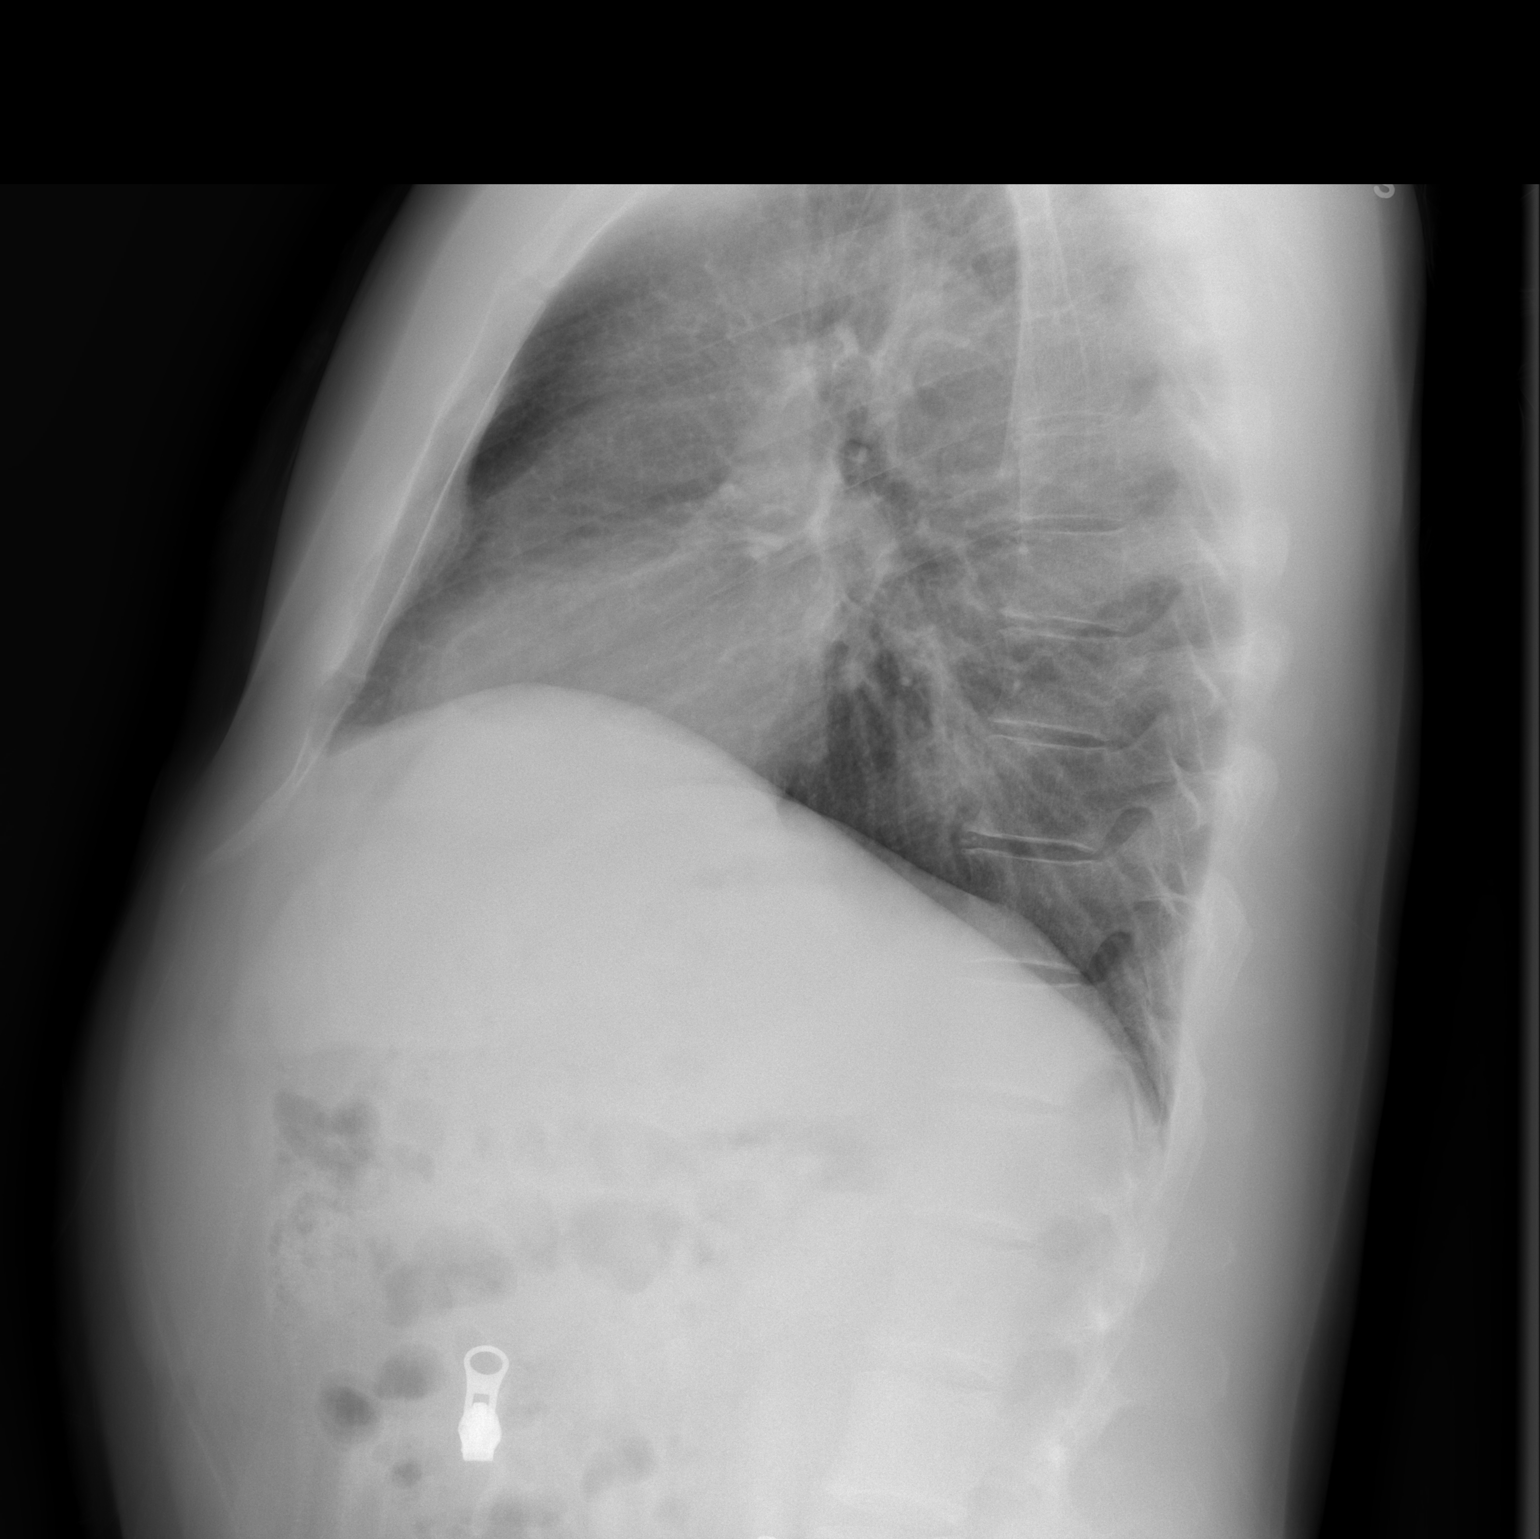

[2 of 2 positions shown; findings below may reference images not displayed]

FINDINGS: Normal heart size, mediastinal contours, and pulmonary vascularity.
Lungs clear.
Bones unremarkable.
No pneumothorax.
IMPRESSION: Normal exam.

## 2014-12-08 ENCOUNTER — Encounter: Payer: Self-pay | Admitting: Emergency Medicine

## 2015-02-09 ENCOUNTER — Ambulatory Visit (INDEPENDENT_AMBULATORY_CARE_PROVIDER_SITE_OTHER): Payer: Managed Care, Other (non HMO) | Admitting: Physician Assistant

## 2015-02-09 ENCOUNTER — Ambulatory Visit (INDEPENDENT_AMBULATORY_CARE_PROVIDER_SITE_OTHER): Payer: Managed Care, Other (non HMO)

## 2015-02-09 VITALS — BP 120/78 | HR 73 | Temp 98.0°F | Resp 16 | Ht 73.0 in | Wt 234.0 lb

## 2015-02-09 DIAGNOSIS — M7741 Metatarsalgia, right foot: Secondary | ICD-10-CM

## 2015-02-09 DIAGNOSIS — M2580 Other specified joint disorders, unspecified joint: Secondary | ICD-10-CM | POA: Diagnosis not present

## 2015-02-09 MED ORDER — NAPROXEN 500 MG PO TABS: 500.0000 mg | ORAL_TABLET | Freq: Two times a day (BID) | ORAL | Status: DC

## 2015-02-09 NOTE — Progress Notes (Signed)
   02/09/2015 7:14 PM   DOB: 1965/12/06 / MRN: [id number]  SUBJECTIVE:  [name]. is a 49 y.o. male presenting for right metatarsalgia at the base of the right great toe that he states feels like a bruise.  This has been present for roughly 1 year and has worsened recently.  He has not tried anything for the problem. Denies warmth and exquisite tenderness of the affected area.  No history of trauma to the area.  Pain is worse in the morning and better after physical activity.    He has No Known Allergies.   He  has a past medical history of Cancer ([place]).    He  reports that he has been smoking Cigars.  He has never used smokeless tobacco. He reports that he drinks alcohol. He reports that he does not use illicit drugs. He  reports that he currently engages in sexual activity. The patient  has past surgical history that includes Vasectomy; Fracture surgery; Robot assisted laparoscopic radical prostatectomy (N/A, 04/15/2012); and Lymphadenectomy (Bilateral, 04/15/2012).  His family history includes Heart disease in his father.  Review of Systems  Musculoskeletal: Positive for joint pain. Negative for falls.    Problem list and medications reviewed and updated by myself where necessary, and exist elsewhere in the encounter.   OBJECTIVE:  BP 120/78 mmHg  Pulse 73  Temp(Src) 98 F (36.7 C) (Oral)  Resp 16  Ht 6\' 1"  (1.854 m)  Wt 234 lb (106.142 kg)  BMI 30.88 kg/m2  SpO2 99% CrCl cannot be calculated (Patient has no serum creatinine result on file.).  Physical Exam  Constitutional: He is oriented to person, place, and time. He appears well-developed.  Eyes: Conjunctivae and EOM are normal. Pupils are equal, round, and reactive to light.  Cardiovascular: Normal rate.   Pulmonary/Chest: Effort normal.  Neurological: He is alert and oriented to person, place, and time. No cranial nerve deficit.  Skin: Skin is warm and dry. No rash noted. He is not diaphoretic. No erythema. No  pallor.  Psychiatric: He has a normal mood and affect.     [facility] reading (PRIMARY) by  PA Clark: STAT read please comment: Metatarsalgia at the base of the great right toe.    No results found for this or any previous visit (from the past 48 hour(s)).  ASSESSMENT AND PLAN  Jacaden was seen today for foot pain.  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   The patient was advised to call or return to clinic if he does not see an improvement in symptoms or to seek the care of the closest emergency department if he worsens with the above plan.   Philis Fendt, MHS, PA-C [facility] 02/09/2015 7:14 PM

## 2015-03-21 ENCOUNTER — Telehealth: Payer: Self-pay | Admitting: Family Medicine

## 2015-03-21 NOTE — Telephone Encounter (Signed)
lmom to call and reschedule appt with daub 

## 2015-05-19 ENCOUNTER — Ambulatory Visit (INDEPENDENT_AMBULATORY_CARE_PROVIDER_SITE_OTHER): Payer: Managed Care, Other (non HMO) | Admitting: Physician Assistant

## 2015-05-19 ENCOUNTER — Encounter: Payer: Self-pay | Admitting: Physician Assistant

## 2015-05-19 VITALS — BP 120/88 | HR 79 | Temp 98.9°F | Resp 16 | Ht 71.5 in | Wt 228.0 lb

## 2015-05-19 DIAGNOSIS — Z139 Encounter for screening, unspecified: Secondary | ICD-10-CM | POA: Diagnosis not present

## 2015-05-19 DIAGNOSIS — Z0000 Encounter for general adult medical examination without abnormal findings: Secondary | ICD-10-CM

## 2015-05-19 LAB — CBC
HCT: 43.7 % (ref 39.0–52.0)
Hemoglobin: 15 g/dL (ref 13.0–17.0)
MCH: 31 pg (ref 26.0–34.0)
MCHC: 34.3 g/dL (ref 30.0–36.0)
MCV: 90.3 fL (ref 78.0–100.0)
MPV: 8.7 fL (ref 8.6–12.4)
PLATELETS: 246 10*3/uL (ref 150–400)
RBC: 4.84 MIL/uL (ref 4.22–5.81)
RDW: 13.5 % (ref 11.5–15.5)
WBC: 5.4 10*3/uL (ref 4.0–10.5)

## 2015-05-19 LAB — LIPID PANEL
Cholesterol: 208 mg/dL — ABNORMAL HIGH (ref 125–200)
HDL: 58 mg/dL (ref >=40)
LDL Cholesterol: 136 mg/dL — ABNORMAL HIGH (ref <130)
TRIGLYCERIDES: 69 mg/dL (ref <150)
Total CHOL/HDL Ratio: 3.6 Ratio (ref <=5.0)
VLDL: 14 mg/dL (ref <30)

## 2015-05-19 LAB — COMPLETE METABOLIC PANEL WITH GFR
ALBUMIN: 4.4 g/dL (ref 3.6–5.1)
ALK PHOS: 71 U/L (ref 40–115)
ALT: 38 U/L (ref 9–46)
AST: 28 U/L (ref 10–35)
BILIRUBIN TOTAL: 0.8 mg/dL (ref 0.2–1.2)
BUN: 16 mg/dL (ref 7–25)
CO2: 27 mmol/L (ref 20–31)
Calcium: 9.8 mg/dL (ref 8.6–10.3)
Chloride: 102 mmol/L (ref 98–110)
Creat: 1.2 mg/dL (ref 0.70–1.33)
GFR, EST AFRICAN AMERICAN: 80 mL/min (ref >=60)
GFR, EST NON AFRICAN AMERICAN: 70 mL/min (ref >=60)
Glucose, Bld: 96 mg/dL (ref 65–99)
POTASSIUM: 4.3 mmol/L (ref 3.5–5.3)
Sodium: 139 mmol/L (ref 135–146)
TOTAL PROTEIN: 7.1 g/dL (ref 6.1–8.1)

## 2015-05-19 NOTE — Patient Instructions (Signed)
Please return in one year for a repeat physical.

## 2015-05-19 NOTE — Progress Notes (Signed)
05/19/2015 2:59 PM   DOB: [birth year] / MRN: [id number]  SUBJECTIVE:  [name]. is a 50 y.o. male presenting for an annual physical.  He is smoking cigars, roughly 1 per week and does not inhale. He is aware to the risks associated with this.  He has a history of prostate cancer and had radical prostectomy.  He has never had a colonoscopy per [facility] and declines this today.  No HIV and Hep C screening exist in [facility]. He would like a screening for testosterone.    Immunization History  Administered Date(s) Administered  . Tdap 12/26/2008     Current outpatient prescriptions:  Marland Kitchen  Multiple Vitamin (MULTIVITAMIN) tablet, Take 1 tablet by mouth daily., Disp: , Rfl:  .  naproxen (NAPROSYN) 500 MG tablet, Take 1 tablet (500 mg total) by mouth 2 (two) times daily with a meal. (Patient not taking: Reported on 05/19/2015), Disp: 30 tablet, Rfl: 0 .  tadalafil (CIALIS) 10 MG tablet, Take 10 mg by mouth daily as needed for erectile dysfunction. Reported on 05/19/2015, Disp: , Rfl:   He has No Known Allergies.   He  has a past medical history of Cancer ([place]).    He  reports that he has been smoking Cigars.  He has never used smokeless tobacco. He reports that he drinks alcohol. He reports that he does not use illicit drugs. He  reports that he currently engages in sexual activity. The patient  has past surgical history that includes Vasectomy; Fracture surgery; Robot assisted laparoscopic radical prostatectomy (N/A, 04/15/2012); and Lymphadenectomy (Bilateral, 04/15/2012).  His family history includes Heart disease in his father.  Review of Systems  Constitutional: Negative for fever and chills.  Eyes: Negative for blurred vision.  Respiratory: Negative for cough and shortness of breath.   Cardiovascular: Negative for chest pain.  Gastrointestinal: Negative for nausea and abdominal pain.  Genitourinary: Negative for dysuria, urgency and frequency.  Musculoskeletal: Negative for myalgias.    Skin: Negative for rash.  Neurological: Negative for dizziness, tingling and headaches.  Psychiatric/Behavioral: Negative for depression. The patient is not nervous/anxious.     Problem list and medications reviewed and updated by myself where necessary, and exist elsewhere in the encounter.   OBJECTIVE:  BP 120/88 mmHg  Pulse 79  Temp(Src) 98.9 F (37.2 C) (Oral)  Resp 16  Ht 5' 11.5" (1.816 m)  Wt 228 lb (103.42 kg)  BMI 31.36 kg/m2  SpO2 99%  Physical Exam  Constitutional: He is oriented to person, place, and time. He appears well-developed. He does not appear ill.  Eyes: Conjunctivae and EOM are normal. Pupils are equal, round, and reactive to light.  Cardiovascular: Normal rate, regular rhythm and normal heart sounds.   Pulmonary/Chest: Effort normal and breath sounds normal.  Abdominal: He exhibits no distension.  Musculoskeletal: Normal range of motion.  Neurological: He is alert and oriented to person, place, and time. No cranial nerve deficit. Coordination normal.  Skin: Skin is warm and dry. He is not diaphoretic.  Psychiatric: He has a normal mood and affect.  Nursing note and vitals reviewed.   No results found for this or any previous visit (from the past 72 hour(s)).  No results found.  ASSESSMENT AND PLAN  [name] was seen today for annual exam.  Diagnoses and all orders for this visit:  Annual physical exam  Screening -     CBC -     COMPLETE METABOLIC PANEL WITH GFR -     Lipid  panel -     Hemoglobin A1c -     HIV antibody -     Hepatitis C antibody -     Testosterone Total,Free,Bio, Males; Future  Other orders -     Cancel: Ambulatory referral to Gastroenterology    The patient was advised to call or return to clinic if he does not see an improvement in symptoms or to seek the care of the closest emergency department if he worsens with the above plan.   Philis Fendt, MHS, PA-C [facility] 05/19/2015 2:59 PM

## 2015-05-20 ENCOUNTER — Encounter: Payer: Self-pay | Admitting: *Deleted

## 2015-05-20 ENCOUNTER — Other Ambulatory Visit (INDEPENDENT_AMBULATORY_CARE_PROVIDER_SITE_OTHER): Payer: Managed Care, Other (non HMO)

## 2015-05-20 DIAGNOSIS — Z139 Encounter for screening, unspecified: Secondary | ICD-10-CM | POA: Diagnosis not present

## 2015-05-20 LAB — HEMOGLOBIN A1C
Hgb A1c MFr Bld: 5.7 % — ABNORMAL HIGH (ref <5.7)
Mean Plasma Glucose: 117 mg/dL — ABNORMAL HIGH (ref <117)

## 2015-05-20 LAB — HIV ANTIBODY (ROUTINE TESTING W REFLEX): HIV 1&2 Ab, 4th Generation: NONREACTIVE

## 2015-05-20 LAB — HEPATITIS C ANTIBODY: HCV AB: NEGATIVE

## 2015-05-21 LAB — TESTOSTERONE TOTAL,FREE,BIO, MALES
ALBUMIN: 4.1 g/dL (ref 3.6–5.1)
SEX HORMONE BINDING: 26 nmol/L (ref 10–50)
TESTOSTERONE BIOAVAILABLE: 93.9 ng/dL — AB (ref 130.5–681.7)
TESTOSTERONE FREE: 49.9 pg/mL (ref 47.0–244.0)
Testosterone: 315 ng/dL (ref 250–827)

## 2015-05-21 NOTE — Addendum Note (Signed)
Addended by: Wyatt Haste on: 05/21/2015 08:37 AM   Modules accepted: Miquel Dunn

## 2015-05-22 ENCOUNTER — Other Ambulatory Visit: Payer: Self-pay | Admitting: Physician Assistant

## 2015-05-22 DIAGNOSIS — R7989 Other specified abnormal findings of blood chemistry: Secondary | ICD-10-CM

## 2015-05-25 ENCOUNTER — Encounter: Payer: Managed Care, Other (non HMO) | Admitting: Emergency Medicine

## 2015-05-28 ENCOUNTER — Telehealth: Payer: Self-pay | Admitting: *Deleted

## 2015-05-28 NOTE — Telephone Encounter (Signed)
LABS FAXED TO DR Alinda Money.  [phone]

## 2016-11-13 IMAGING — CR DG FOOT COMPLETE 3+V*R*
2 series · 2 of 2 positions shown · non-contrast
Comparison: None.

CLINICAL DATA: Acute onset of pain at the base of the right great
toe. Initial encounter.

EXAM:
RIGHT FOOT COMPLETE - 3+ VIEW

[AP]
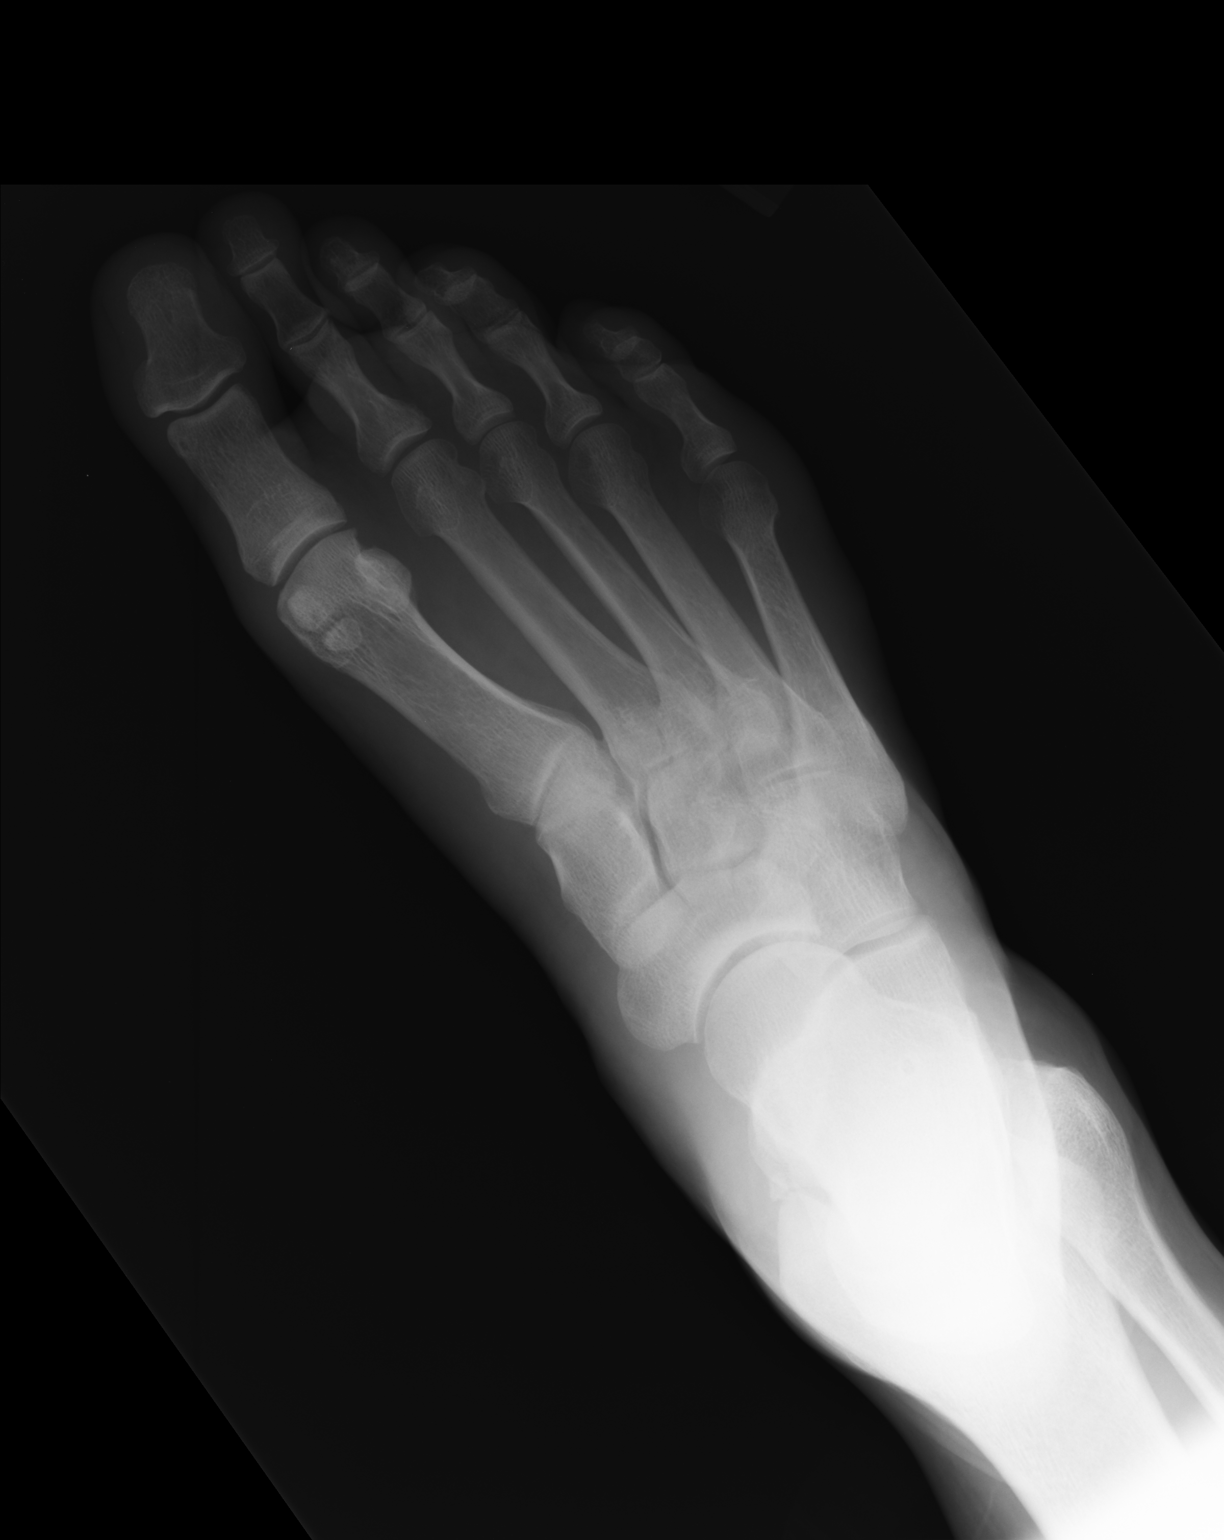

[ap obl int rot]
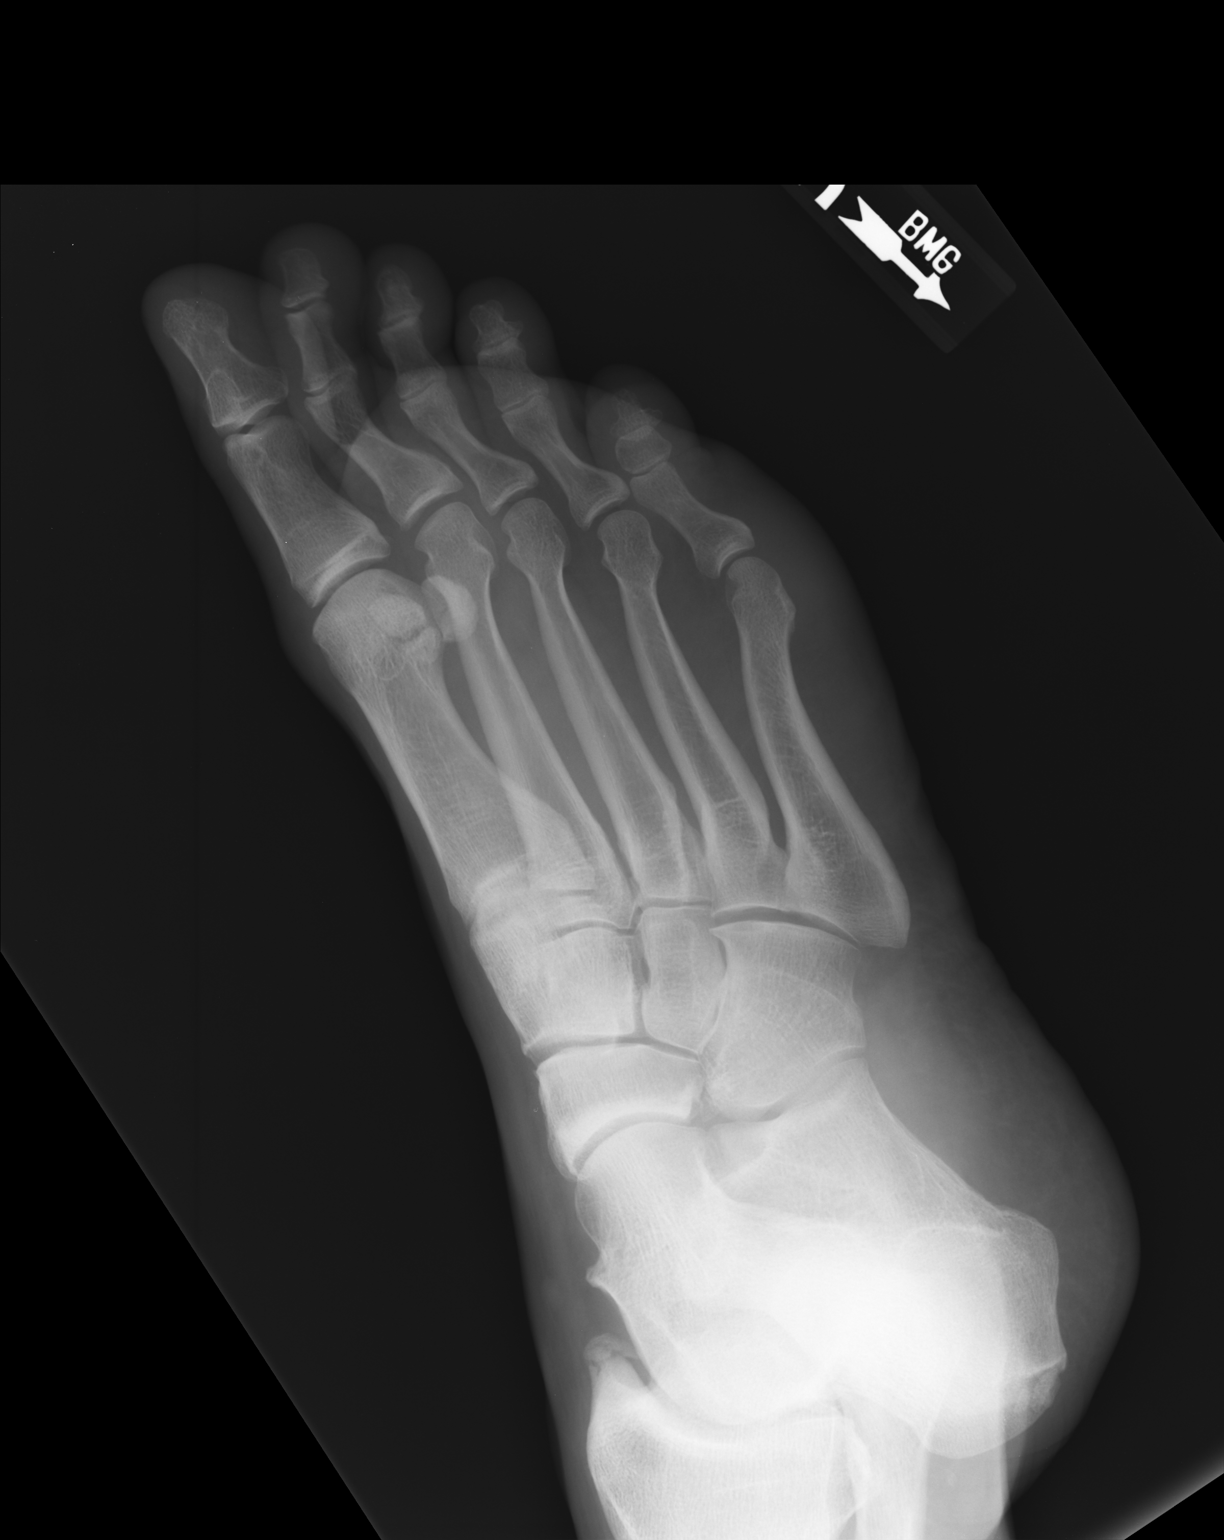

[2 of 2 positions shown; findings below may reference images not displayed]

FINDINGS: There is no evidence of fracture or dislocation. The joint spaces
are preserved. There is no evidence of talar subluxation; the
subtalar joint is unremarkable in appearance. Note is made of a
bipartite medial sesamoid of the first toe. An os subtibiale is
noted. Small plantar and posterior calcaneal spurs are seen.

No significant soft tissue abnormalities are seen.
IMPRESSION: 1. No evidence of fracture or dislocation.
2. Bipartite medial sesamoid of the first toe.
3. Os subtibiale noted.

## 2017-10-22 ENCOUNTER — Ambulatory Visit: Payer: 59 | Admitting: Podiatry

## 2018-02-11 ENCOUNTER — Ambulatory Visit: Payer: 59 | Admitting: Podiatry

## 2018-02-18 ENCOUNTER — Ambulatory Visit: Payer: 59 | Admitting: Podiatry

## 2018-02-18 ENCOUNTER — Encounter: Payer: Self-pay | Admitting: Podiatry

## 2018-02-18 ENCOUNTER — Telehealth: Payer: Self-pay | Admitting: *Deleted

## 2018-02-18 ENCOUNTER — Ambulatory Visit (INDEPENDENT_AMBULATORY_CARE_PROVIDER_SITE_OTHER): Payer: 59

## 2018-02-18 VITALS — BP 145/70 | HR 98

## 2018-02-18 DIAGNOSIS — M722 Plantar fascial fibromatosis: Secondary | ICD-10-CM

## 2018-02-18 NOTE — Telephone Encounter (Signed)
Orders to Gretta Arab, RN for pre-cert, and faxed to [facility].

## 2018-02-18 NOTE — Telephone Encounter (Signed)
-----   Message from Edrick Kins, DPM sent at 02/18/2018  9:02 AM EST ----- Regarding: MRI RT forefoot Please order MRI right forefoot without contrast.   Diagnosis: Fibular sesamoid fracture versus bipartite sesamoid.  Also previously diagnosed with Morton's neuroma second interspace right foot.   Thanks, Dr. Amalia Hailey

## 2018-02-18 NOTE — Progress Notes (Signed)
   HPI: 52 year old male presents the office today as a new patient for evaluation regarding right foot pain.  Patient is very active playing racquetball and otherwise very healthy.  He says that he had surgery on the foot a few years back which temporarily alleviated the patient's symptoms.  He says that now the foot is bothering him again.  He feels like he is walking on a bruise in the foot.  Despite wearing orthotics he continues to have pain.  Conservative treatments have been unsuccessful.  Patient recently went and saw Dr. Gershon Mussel approximately 2-3 months ago and was diagnosed with a Morton's neuroma right second interspace.  He presents for further treatment and evaluation and second opinion.  Past Medical History:  Diagnosis Date  . Cancer ([place])    biopsy done 12'13-dx. Prostate Cancer     Physical Exam: General: The patient is alert and oriented x3 in no acute distress.  Dermatology: Skin is warm, dry and supple bilateral lower extremities. Negative for open lesions or macerations.  Vascular: Palpable pedal pulses bilaterally. No edema or erythema noted. Capillary refill within normal limits.  Neurological: Epicritic and protective threshold grossly intact bilaterally.   Musculoskeletal Exam: Significant amount of pain on palpation to the fibular sesamoid right foot.  Palpation seems to reproduce the patient's area of symptoms.  Negative for pain on palpation to the second intermetatarsal space with lateral compression.  Clinical findings do not appear to be Morton's neuroma  Radiographic Exam:  Normal osseous mineralization. Joint spaces preserved. No fracture/dislocation/boney destruction.  Absence of the tibial sesamoid noted correlating with a surgical incision noted to the medial aspect of the first MTPJ right foot.  Fracture/fragmentation versus bipartite fibular sesamoid noted.  Assessment: 1.  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  History of tibial  sesamoidectomy right foot   Plan of Care:  1. Patient evaluated. X-Rays reviewed.  2.  Today we are going to order an MRI right forefoot to rule out fracture versus bipartite sesamoid.  I explained to the patient that he has had a tibial sesamoidectomy in the past.  Patient seems to have been unaware of what surgery was performed. 3.  I also explained to the patient the function and role of the sesamoidal apparatus.  I explained that I cannot take out the remaining fibular sesamoid without fusing the first MTPJ.  4.  Conservative treatments and modalities have been unsuccessful to provide any sort of alleviation of symptoms for the patient.  Return to clinic in 4 weeks to review MRI results  *Very active and plays racquetball.  Goes on a cruise 03/17/2018.  Engineer, civil (consulting).  Goes by Ed.      Edrick Kins, DPM [facility]  Dr. Edrick Kins, DPM    [address], [place] [address]                Office [phone]  Fax [phone]

## 2018-03-02 ENCOUNTER — Ambulatory Visit (HOSPITAL_COMMUNITY): Payer: 59

## 2018-03-27 ENCOUNTER — Ambulatory Visit: Payer: 59 | Admitting: Podiatry

## 2018-04-17 ENCOUNTER — Encounter: Payer: Self-pay | Admitting: Podiatry

## 2018-04-17 ENCOUNTER — Ambulatory Visit: Payer: 59 | Admitting: Podiatry

## 2018-04-17 DIAGNOSIS — M2580 Other specified joint disorders, unspecified joint: Secondary | ICD-10-CM

## 2018-04-22 NOTE — Progress Notes (Signed)
   HPI: 53 year old male presents to the office today for follow up evaluation of right foot pain. He states his pain has not changed much. He states he is unable to get an MRI do to his insurance and states he does not want surgery. Patient is here for further evaluation and treatment.   Past Medical History:  Diagnosis Date  . Cancer ([place])    biopsy done 12'13-dx. Prostate Cancer     Physical Exam: General: The patient is alert and oriented x3 in no acute distress.  Dermatology: Skin is warm, dry and supple bilateral lower extremities. Negative for open lesions or macerations.  Vascular: Palpable pedal pulses bilaterally. No edema or erythema noted. Capillary refill within normal limits.  Neurological: Epicritic and protective threshold grossly intact bilaterally.   Musculoskeletal Exam: Significant amount of pain on palpation to the fibular sesamoid right foot.  Palpation seems to reproduce the patient's area of symptoms.  Negative for pain on palpation to the second intermetatarsal space with lateral compression.  Clinical findings do not appear to be Morton's neuroma  Assessment: 1.  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  History of tibial sesamoidectomy right foot   Plan of Care:  1. Patient evaluated.  2. Discussed different conservative treatment options.  3. Recommended OTC arch supports and he could cut out the 1st MTPJ area.  4. Recommended possible custom orthotics.  5. Recommended [place] running shoes.  6. Continue taking OTC Motrin as needed.  7. Return to clinic as needed.   *Very active and plays racquetball.  Goes on a cruise 03/17/2018.  Engineer, civil (consulting).  Goes by Ed.      Edrick Kins, DPM [facility]  Dr. Edrick Kins, DPM    [address], [place] [address]                Office [phone]  Fax [phone]

## 2022-09-12 DIAGNOSIS — M7521 Bicipital tendinitis, right shoulder: Secondary | ICD-10-CM | POA: Diagnosis not present

## 2022-09-12 DIAGNOSIS — M1811 Unilateral primary osteoarthritis of first carpometacarpal joint, right hand: Secondary | ICD-10-CM | POA: Diagnosis not present

## 2022-10-03 DIAGNOSIS — M7521 Bicipital tendinitis, right shoulder: Secondary | ICD-10-CM | POA: Diagnosis not present

## 2022-12-01 DIAGNOSIS — M6281 Muscle weakness (generalized): Secondary | ICD-10-CM | POA: Diagnosis not present

## 2022-12-01 DIAGNOSIS — M7521 Bicipital tendinitis, right shoulder: Secondary | ICD-10-CM | POA: Diagnosis not present

## 2022-12-13 DIAGNOSIS — M7521 Bicipital tendinitis, right shoulder: Secondary | ICD-10-CM | POA: Diagnosis not present

## 2022-12-13 DIAGNOSIS — M6281 Muscle weakness (generalized): Secondary | ICD-10-CM | POA: Diagnosis not present

## 2023-07-04 DIAGNOSIS — G5602 Carpal tunnel syndrome, left upper limb: Secondary | ICD-10-CM | POA: Diagnosis not present

## 2023-09-05 DIAGNOSIS — C61 Malignant neoplasm of prostate: Secondary | ICD-10-CM | POA: Diagnosis not present

## 2023-09-05 DIAGNOSIS — I129 Hypertensive chronic kidney disease with stage 1 through stage 4 chronic kidney disease, or unspecified chronic kidney disease: Secondary | ICD-10-CM | POA: Diagnosis not present

## 2023-09-05 DIAGNOSIS — Z1212 Encounter for screening for malignant neoplasm of rectum: Secondary | ICD-10-CM | POA: Diagnosis not present

## 2023-09-05 DIAGNOSIS — E291 Testicular hypofunction: Secondary | ICD-10-CM | POA: Diagnosis not present

## 2023-09-12 DIAGNOSIS — I129 Hypertensive chronic kidney disease with stage 1 through stage 4 chronic kidney disease, or unspecified chronic kidney disease: Secondary | ICD-10-CM | POA: Diagnosis not present

## 2023-09-12 DIAGNOSIS — Z1331 Encounter for screening for depression: Secondary | ICD-10-CM | POA: Diagnosis not present

## 2023-09-12 DIAGNOSIS — Z23 Encounter for immunization: Secondary | ICD-10-CM | POA: Diagnosis not present

## 2023-09-12 DIAGNOSIS — R82998 Other abnormal findings in urine: Secondary | ICD-10-CM | POA: Diagnosis not present

## 2023-09-12 DIAGNOSIS — Z0000 Encounter for general adult medical examination without abnormal findings: Secondary | ICD-10-CM | POA: Diagnosis not present

## 2023-09-12 DIAGNOSIS — N1831 Chronic kidney disease, stage 3a: Secondary | ICD-10-CM | POA: Diagnosis not present

## 2023-09-28 DIAGNOSIS — Z1211 Encounter for screening for malignant neoplasm of colon: Secondary | ICD-10-CM | POA: Diagnosis not present

## 2023-12-14 ENCOUNTER — Other Ambulatory Visit: Payer: Self-pay | Admitting: Nurse Practitioner

## 2023-12-14 ENCOUNTER — Ambulatory Visit
Admission: RE | Admit: 2023-12-14 | Discharge: 2023-12-14 | Disposition: A | Discharge status: Home / Self Care | Source: Ambulatory Visit | Attending: Nurse Practitioner | Admitting: Nurse Practitioner

## 2023-12-14 DIAGNOSIS — R0609 Other forms of dyspnea: Secondary | ICD-10-CM | POA: Diagnosis not present

## 2023-12-14 DIAGNOSIS — Z8546 Personal history of malignant neoplasm of prostate: Secondary | ICD-10-CM | POA: Diagnosis not present

## 2023-12-14 DIAGNOSIS — J40 Bronchitis, not specified as acute or chronic: Secondary | ICD-10-CM | POA: Diagnosis not present

## 2023-12-14 MED ORDER — IOPAMIDOL (ISOVUE-370) INJECTION 76%
75.0000 mL | Freq: Once | INTRAVENOUS | Status: AC | PRN
  Administered 2023-12-14: 75 mL via INTRAVENOUS

## 2024-01-02 ENCOUNTER — Other Ambulatory Visit: Payer: Self-pay | Admitting: Nurse Practitioner

## 2024-01-02 DIAGNOSIS — Z0389 Encounter for observation for other suspected diseases and conditions ruled out: Secondary | ICD-10-CM

## 2024-01-11 ENCOUNTER — Other Ambulatory Visit

## 2024-01-11 ENCOUNTER — Ambulatory Visit
Admission: RE | Admit: 2024-01-11 | Discharge: 2024-01-11 | Disposition: A | Discharge status: Home / Self Care | Source: Ambulatory Visit | Attending: Nurse Practitioner | Admitting: Nurse Practitioner

## 2024-01-11 DIAGNOSIS — I2510 Atherosclerotic heart disease of native coronary artery without angina pectoris: Secondary | ICD-10-CM | POA: Diagnosis not present

## 2024-01-11 DIAGNOSIS — Z0389 Encounter for observation for other suspected diseases and conditions ruled out: Secondary | ICD-10-CM

## 2024-01-14 DIAGNOSIS — I2510 Atherosclerotic heart disease of native coronary artery without angina pectoris: Secondary | ICD-10-CM | POA: Diagnosis not present

## 2024-01-14 DIAGNOSIS — I129 Hypertensive chronic kidney disease with stage 1 through stage 4 chronic kidney disease, or unspecified chronic kidney disease: Secondary | ICD-10-CM | POA: Diagnosis not present

## 2024-02-03 NOTE — Progress Notes (Unsigned)
 No chief complaint on file.  History of Present Illness: 58 yo male with history of CAD, prostate cancer, CKD, HTN and HLD who is here today as a new patient for the evaluation of abnormal coronary calcium score. CT coronary calcium score of 2979 on 01/11/24.   Primary Care Physician: Patient, No Pcp Per   Past Medical History:  Diagnosis Date   Aortic atherosclerosis    CAD (coronary artery disease)    Cancer ([facility])    biopsy done 12'13-dx. Prostate Cancer   Carpal tunnel syndrome    CKD (chronic kidney disease)    History of prostatectomy    HLD (hyperlipidemia)    HTN (hypertension)    Hx of prostatic malignancy    Hypogonadism in male    Prostate cancer [facility])     Past Surgical History:  Procedure Laterality Date   FRACTURE SURGERY     retained hardware left ankle   LYMPHADENECTOMY Bilateral 04/15/2012   Procedure: LYMPHADENECTOMY;  Surgeon: Noretta Ferrara, MD;  Location: [facility];  Service: Urology;  Laterality: Bilateral;   ROBOT ASSISTED LAPAROSCOPIC RADICAL PROSTATECTOMY N/A 04/15/2012   Procedure: ROBOTIC ASSISTED LAPAROSCOPIC RADICAL PROSTATECTOMY LEVEL 2;  Surgeon: Noretta Ferrara, MD;  Location: [facility]S;  Service: Urology;  Laterality: N/A;   VASECTOMY      Current Outpatient Medications  Medication Sig Dispense Refill   aspirin EC 81 MG tablet Take 81 mg by mouth daily. Swallow whole.     Magnesium 250 MG TABS Take by mouth.     Multiple Vitamin (MULTIVITAMIN) tablet Take 1 tablet by mouth daily.     naproxen  (NAPROSYN ) 500 MG tablet Take 1 tablet (500 mg total) by mouth 2 (two) times daily with a meal. 30 tablet 0   olmesartan (BENICAR) 40 MG tablet Take 40 mg by mouth daily.     rosuvastatin (CRESTOR) 10 MG tablet Take 10 mg by mouth daily.     tadalafil (CIALIS) 10 MG tablet Take 10 mg by mouth daily as needed for erectile dysfunction. Reported on 05/19/2015     testosterone  cypionate (DEPOTESTOSTERONE CYPIONATE) 200 MG/ML injection Inject into the muscle every 14  (fourteen) days.     No current facility-administered medications for this visit.    No Known Allergies  Social History   Socioeconomic History   Marital status: Married    Spouse name: Not on file   Number of children: Not on file   Years of education: Not on file   Highest education level: Not on file  Occupational History   Not on file  Tobacco Use   Smoking status: Smoker, Current Status Unknown    Types: Cigars   Smokeless tobacco: Never   Tobacco comments:    occ. cigar  Substance and Sexual Activity   Alcohol use: Yes    Alcohol/week: 0.0 standard drinks of alcohol    Comment: socially weekends   Drug use: No   Sexual activity: Yes  Other Topics Concern   Not on file  Social History Narrative   Not on file   Social Drivers of Health   Financial Resource Strain: Not on file  Food Insecurity: Not on file  Transportation Needs: Not on file  Physical Activity: Not on file  Stress: Not on file  Social Connections: Not on file  Intimate Partner Violence: Not on file    Family History  Problem Relation Age of Onset   Heart disease Father     Review of Systems:  As stated  in the HPI and otherwise negative.   There were no vitals taken for this visit.  Physical Examination: General: Well developed, well nourished, NAD  HEENT: OP clear, mucus membranes moist  SKIN: warm, dry. No rashes. Neuro: No focal deficits  Musculoskeletal: Muscle strength 5/5 all ext  Psychiatric: Mood and affect normal  Neck: No JVD, no carotid bruits, no thyromegaly, no lymphadenopathy.  Lungs:Clear bilaterally, no wheezes, rhonci, crackles Cardiovascular: Regular rate and rhythm. No murmurs, gallops or rubs. Abdomen:Soft. Bowel sounds present. Non-tender.  Extremities: No lower extremity edema. Pulses are 2 + in the bilateral DP/PT.  EKG:  EKG {ACTION; IS/IS [id number]} ordered today. The ekg ordered today demonstrates ***  Recent Labs: No results found for requested labs  within last 365 days.   Lipid Panel    Component Value Date/Time   CHOL 208 (H) 05/19/2015 1453   TRIG 69 05/19/2015 1453   HDL 58 05/19/2015 1453   CHOLHDL 3.6 05/19/2015 1453   VLDL 14 05/19/2015 1453   LDLCALC 136 (H) 05/19/2015 1453     Wt Readings from Last 3 Encounters:  05/19/15 228 lb (103.4 kg)  02/09/15 234 lb (106.1 kg)  07/14/13 225 lb 6.4 oz (102.2 kg)      Assessment and Plan:   1.   Labs/ tests ordered today include:  No orders of the defined types were placed in this encounter.    Disposition:   F/U with me in ***    Signed, Lonni Cash, MD, [facility] 02/03/2024 7:01 PM    [facility] [address] [place], [place], [place]  [address] Phone: [phone]; Fax: [phone]

## 2024-02-04 ENCOUNTER — Ambulatory Visit: Attending: Cardiovascular Disease | Admitting: Cardiovascular Disease

## 2024-02-04 ENCOUNTER — Encounter: Payer: Self-pay | Admitting: Cardiovascular Disease

## 2024-02-04 VITALS — BP 158/98 | HR 71 | Ht 71.5 in | Wt 228.6 lb

## 2024-02-04 DIAGNOSIS — I2510 Atherosclerotic heart disease of native coronary artery without angina pectoris: Secondary | ICD-10-CM

## 2024-02-04 DIAGNOSIS — I10 Essential (primary) hypertension: Secondary | ICD-10-CM | POA: Diagnosis not present

## 2024-02-04 MED ORDER — AMLODIPINE BESYLATE 5 MG PO TABS: 5.0000 mg | ORAL_TABLET | Freq: Every day | ORAL | 3 refills | Status: AC

## 2024-02-04 NOTE — Patient Instructions (Addendum)
 Medication Instructions:  Your physician has recommended you make the following change in your medication:  Start amlodipine 5 mg by mouth daily   *If you need a refill on your cardiac medications before your next appointment, please call your pharmacy*  Lab Work: none If you have labs (blood work) drawn today and your tests are completely normal, you will receive your results only by: MyChart Message (if you have MyChart) OR A paper copy in the mail If you have any lab test that is abnormal or we need to change your treatment, we will call you to review the results.  Testing/Procedures: Dr Verlin has requested you have a cardiac PET CT Scan  Follow-Up: At [facility], you and your health needs are our priority.  As part of our continuing mission to provide you with exceptional heart care, our providers are all part of one team.  This team includes your primary Cardiologist (physician) and Advanced Practice Providers or APPs (Physician Assistants and Nurse Practitioners) who all work together to provide you with the care you need, when you need it.  Your next appointment:  March 24, 2024 at 10:40   Provider:   Lonni Verlin, MD    We recommend signing up for the patient portal called MyChart.  Sign up information is provided on this After Visit Summary.  MyChart is used to connect with patients for Virtual Visits (Telemedicine).  Patients are able to view lab/test results, encounter notes, upcoming appointments, etc.  Non-urgent messages can be sent to your provider as well.   To learn more about what you can do with MyChart, go to [email or web address].   Other Instructions    Please report to Radiology at the [facility] Main Entrance 30 minutes early for your test.  [address] [place], [place] [address]                         OR   Please report to Radiology at [facility] Main Entrance, medical mall, 30 mins prior  to your test.  [address]  [place], [place]  How to Prepare for Your Cardiac PET/CT Stress Test:  Nothing to eat or drink, except water , 3 hours prior to arrival time.  NO caffeine/decaffeinated products, or chocolate 12 hours prior to arrival. (Please note decaffeinated beverages (teas/coffees) still contain caffeine).  If you have caffeine within 12 hours prior, the test will need to be rescheduled.  Medication instructions: Do not take erectile dysfunction medications for 72 hours prior to test (sildenafil, tadalafil) Do not take nitrates (isosorbide mononitrate, Ranexa) the day before or day of test Do not take tamsulosin the day before or morning of test Hold theophylline containing medications for 12 hours. Hold Dipyridamole 48 hours prior to the test.  Diabetic Preparation: If able to eat breakfast prior to 3 hour fasting, you may take all medications, including your insulin. Do not worry if you miss your breakfast dose of insulin - start at your next meal. If you do not eat prior to 3 hour fast-Hold all diabetes (oral and insulin) medications. Patients who wear a continuous glucose monitor MUST remove the device prior to scanning.  You may take your remaining medications with water .  NO perfume, cologne or lotion on chest or abdomen area. FEMALES - Please avoid wearing dresses to this appointment.  Total time is 1 to 2 hours; you may want to bring reading material for the waiting  time.  IF YOU THINK YOU MAY BE PREGNANT, OR ARE NURSING PLEASE INFORM THE TECHNOLOGIST.  In preparation for your appointment, medication and supplies will be purchased.  Appointment availability is limited, so if you need to cancel or reschedule, please call the Radiology Department Scheduler at [phone] 24 hours in advance to avoid a cancellation fee of $100.00  What to Expect When you Arrive:  Once you arrive and check in for your appointment, you will be taken to a preparation room  within the Radiology Department.  A technologist or Nurse will obtain your medical history, verify that you are correctly prepped for the exam, and explain the procedure.  Afterwards, an IV will be started in your arm and electrodes will be placed on your skin for EKG monitoring during the stress portion of the exam. Then you will be escorted to the PET/CT scanner.  There, staff will get you positioned on the scanner and obtain a blood pressure and EKG.  During the exam, you will continue to be connected to the EKG and blood pressure machines.  A small, safe amount of a radioactive tracer will be injected in your IV to obtain a series of pictures of your heart along with an injection of a stress agent.    After your Exam:  It is recommended that you eat a meal and drink a caffeinated beverage to counter act any effects of the stress agent.  Drink plenty of fluids for the remainder of the day and urinate frequently for the first couple of hours after the exam.  Your doctor will inform you of your test results within 7-10 business days.  For more information and frequently asked questions, please visit our website: [email or web address]  For questions about your test or how to prepare for your test, please call: Cardiac Imaging Nurse Navigators Office: [phone]

## 2024-02-19 ENCOUNTER — Encounter: Payer: Self-pay | Admitting: Cardiovascular Disease

## 2024-03-10 ENCOUNTER — Encounter (HOSPITAL_COMMUNITY): Payer: Self-pay

## 2024-03-11 ENCOUNTER — Telehealth (HOSPITAL_COMMUNITY): Payer: Self-pay | Admitting: Emergency Medicine

## 2024-03-11 NOTE — Telephone Encounter (Signed)
 Reaching out to patient to offer assistance regarding upcoming cardiac imaging study; pt verbalizes understanding of appt date/time, parking situation and where to check in, pre-test NPO status and medications ordered, and verified current allergies; name and call back number provided for further questions should they arise Rockwell Alexandria RN Navigator Cardiac Imaging [facility] [phone] office [phone] cell

## 2024-03-12 ENCOUNTER — Ambulatory Visit: Payer: Self-pay | Admitting: Cardiovascular Disease

## 2024-03-12 ENCOUNTER — Ambulatory Visit (HOSPITAL_COMMUNITY)
Admission: RE | Admit: 2024-03-12 | Discharge: 2024-03-12 | Disposition: A | Discharge status: Home / Self Care | Source: Ambulatory Visit | Attending: Cardiovascular Disease | Admitting: Cardiovascular Disease

## 2024-03-12 DIAGNOSIS — I2510 Atherosclerotic heart disease of native coronary artery without angina pectoris: Secondary | ICD-10-CM | POA: Diagnosis present

## 2024-03-12 LAB — NM PET CT CARDIAC PERFUSION MULTI W/ABSOLUTE BLOODFLOW
LV dias vol: 200 mL (ref 62–150)
MBFR: 2.49
Nuc Rest EF: 30 %
Nuc Stress EF: 37 %
Peak HR: 107 {beats}/min
Rest HR: 87 {beats}/min
Rest MBF: 0.68 ml/g/min
Rest Nuclear Isotope Dose: 26.7 mCi
ST Depression (mm): 0 mm
Stress MBF: 1.69 ml/g/min
Stress Nuclear Isotope Dose: 26.8 mCi
TID: 0.98

## 2024-03-12 MED ORDER — REGADENOSON 0.4 MG/5ML IV SOLN
INTRAVENOUS | Status: AC
  Filled 2024-03-12: qty 5

## 2024-03-12 MED ORDER — RUBIDIUM RB82 GENERATOR (RUBYFILL)
26.7400 | PACK | Freq: Once | INTRAVENOUS | Status: AC
  Administered 2024-03-12: 26.74 via INTRAVENOUS

## 2024-03-12 MED ORDER — RUBIDIUM RB82 GENERATOR (RUBYFILL)
26.7600 | PACK | Freq: Once | INTRAVENOUS | Status: AC
  Administered 2024-03-12: 26.76 via INTRAVENOUS

## 2024-03-12 MED ORDER — REGADENOSON 0.4 MG/5ML IV SOLN
0.4000 mg | Freq: Once | INTRAVENOUS | Status: AC
  Administered 2024-03-12: 0.4 mg via INTRAVENOUS

## 2024-03-14 ENCOUNTER — Ambulatory Visit (HOSPITAL_COMMUNITY)
Admission: RE | Admit: 2024-03-14 | Discharge: 2024-03-14 | Disposition: A | Source: Ambulatory Visit | Attending: Cardiovascular Disease | Admitting: Cardiovascular Disease

## 2024-03-14 ENCOUNTER — Ambulatory Visit: Payer: Self-pay | Admitting: Cardiovascular Disease

## 2024-03-14 DIAGNOSIS — I2510 Atherosclerotic heart disease of native coronary artery without angina pectoris: Secondary | ICD-10-CM | POA: Diagnosis not present

## 2024-03-14 DIAGNOSIS — I77810 Thoracic aortic ectasia: Secondary | ICD-10-CM | POA: Diagnosis not present

## 2024-03-14 DIAGNOSIS — I358 Other nonrheumatic aortic valve disorders: Secondary | ICD-10-CM | POA: Diagnosis not present

## 2024-03-14 DIAGNOSIS — I3481 Nonrheumatic mitral (valve) annulus calcification: Secondary | ICD-10-CM | POA: Insufficient documentation

## 2024-03-14 LAB — ECHOCARDIOGRAM COMPLETE
Area-P 1/2: 4.15 cm2
Calc EF: 52.5 %
S' Lateral: 3.7 cm
Single Plane A2C EF: 52.8 %
Single Plane A4C EF: 52.2 %

## 2024-03-14 NOTE — Progress Notes (Signed)
" °  Echocardiogram 2D Echocardiogram has been performed.  Koleen KANDICE Popper, RDCS 03/14/2024, 3:29 PM "

## 2024-03-23 NOTE — Progress Notes (Unsigned)
 "   No chief complaint on file.  History of Present Illness: 59 yo male with history of CAD, prostate cancer, CKD, HTN and HLD who is here today for follow up. He was seen as a new patient for the evaluation of abnormal coronary calcium score in December 2025. CT coronary calcium score of 2979 on 01/11/24. PET CT stress test in January 2026 with no ischemia. Echo January 2026 with LVEF=50-55%. No valve disease.   He is here today for follow up. The patient denies any chest pain, dyspnea, palpitations, lower extremity edema, orthopnea, PND, dizziness, near syncope or syncope.   Primary Care Physician: Shepard Ade, MD   Past Medical History:  Diagnosis Date   Aortic atherosclerosis    CAD (coronary artery disease)    Cancer (HCC)    biopsy done 12'13-dx. Prostate Cancer   Carpal tunnel syndrome    CKD (chronic kidney disease)    History of prostatectomy    HLD (hyperlipidemia)    HTN (hypertension)    Hx of prostatic malignancy    Hypogonadism in male    Prostate cancer [facility]     Past Surgical History:  Procedure Laterality Date   FRACTURE SURGERY     retained hardware left ankle   LYMPHADENECTOMY Bilateral 04/15/2012   Procedure: LYMPHADENECTOMY;  Surgeon: Noretta Ferrara, MD;  Location: [facility];  Service: Urology;  Laterality: Bilateral;   ROBOT ASSISTED LAPAROSCOPIC RADICAL PROSTATECTOMY N/A 04/15/2012   Procedure: ROBOTIC ASSISTED LAPAROSCOPIC RADICAL PROSTATECTOMY LEVEL 2;  Surgeon: Noretta Ferrara, MD;  Location: [facility];  Service: Urology;  Laterality: N/A;   VASECTOMY      Current Outpatient Medications  Medication Sig Dispense Refill   amLODipine  (NORVASC ) 5 MG tablet Take 1 tablet (5 mg total) by mouth daily. 30 tablet 3   aspirin EC 81 MG tablet Take 81 mg by mouth daily. Swallow whole.     Magnesium 250 MG TABS Take by mouth.     Multiple Vitamin (MULTIVITAMIN) tablet Take 1 tablet by mouth daily.     olmesartan (BENICAR) 40 MG tablet Take 40 mg by mouth daily.      rosuvastatin (CRESTOR) 10 MG tablet Take 10 mg by mouth daily.     testosterone  cypionate (DEPOTESTOSTERONE CYPIONATE) 200 MG/ML injection Inject into the muscle every 14 (fourteen) days.     No current facility-administered medications for this visit.    No Known Allergies  Social History   Socioeconomic History   Marital status: Unknown    Spouse name: Not on file   Number of children: 3   Years of education: Not on file   Highest education level: Not on file  Occupational History   Occupation: Works in chemical engineer supply  Tobacco Use   Smoking status: Smoker, Current Status Unknown    Types: Cigars   Smokeless tobacco: Never   Tobacco comments:    occ. cigar  Substance and Sexual Activity   Alcohol use: Yes    Alcohol/week: 0.0 standard drinks of alcohol    Comment: socially weekends   Drug use: No   Sexual activity: Yes  Other Topics Concern   Not on file  Social History Narrative   Not on file   Social Drivers of Health   Tobacco Use: High Risk (02/04/2024)   Patient History    Smoking Tobacco Use: Smoker, Current Status Unknown    Smokeless Tobacco Use: Never    Passive Exposure: Not on file  Financial Resource Strain: Not on file  Food  Insecurity: Not on file  Transportation Needs: Not on file  Physical Activity: Not on file  Stress: Not on file  Social Connections: Not on file  Intimate Partner Violence: Not on file  Depression (EYV7-0): Not on file  Alcohol Screen: Not on file  Housing: Not on file  Utilities: Not on file  Health Literacy: Not on file    Family History  Problem Relation Age of Onset   Heart disease Father        Pacemaker, aortic aneurysm   Heart attack Paternal Grandfather     Review of Systems:  As stated in the HPI and otherwise negative.   There were no vitals taken for this visit.  Physical Examination: General: Well developed, well nourished, NAD  SKIN: warm, dry. Neuro: No focal deficits  Psychiatric: Mood and affect  normal  Neck: No JVD Lungs:Clear bilaterally, no wheezes, rhonci, crackles Cardiovascular: Regular rate and rhythm. No murmurs, gallops or rubs. Abdomen:Soft.  Extremities: No lower extremity edema.    EKG:  EKG is not *** ordered today. The ekg ordered today demonstrates   Recent Labs: No results found for requested labs within last 365 days.   Lipid Panel    Component Value Date/Time   CHOL 208 (H) 05/19/2015 1453   TRIG 69 05/19/2015 1453   HDL 58 05/19/2015 1453   CHOLHDL 3.6 05/19/2015 1453   VLDL 14 05/19/2015 1453   LDLCALC 136 (H) 05/19/2015 1453     Wt Readings from Last 3 Encounters:  02/04/24 228 lb 9.6 oz (103.7 kg)  05/19/15 228 lb (103.4 kg)  02/09/15 234 lb (106.1 kg)    Assessment and Plan:   1. CAD without angina: He has evidence of CAD with an abnormal coronary calcium score. No ischemic on PET CT stress test in January 2026. He has no chest pain.  -Continue ASA and Crestor  2. HTN: BP is controlled.  -Continue Olmesartan and Norvasc . (Norvasc  started in December 2025).    Labs/ tests ordered today include:  No orders of the defined types were placed in this encounter.  Disposition:   F/U with me in 12 months  Signed, Lonni Cash, MD, [facility] 03/23/2024 5:32 PM    [facility] [address] [place], [place], [place]  [address] Phone: [phone]; Fax: [phone]    "

## 2024-03-24 ENCOUNTER — Ambulatory Visit: Admitting: Cardiovascular Disease

## 2024-03-24 ENCOUNTER — Encounter: Payer: Self-pay | Admitting: Cardiovascular Disease

## 2024-03-24 VITALS — BP 169/95 | HR 68 | Ht 71.5 in | Wt 236.2 lb

## 2024-03-24 DIAGNOSIS — I10 Essential (primary) hypertension: Secondary | ICD-10-CM | POA: Diagnosis not present

## 2024-03-24 DIAGNOSIS — I2510 Atherosclerotic heart disease of native coronary artery without angina pectoris: Secondary | ICD-10-CM

## 2024-03-24 NOTE — Patient Instructions (Signed)
 Medication Instructions:  Your physician recommends that you continue on your current medications as directed. Please refer to the Current Medication list given to you today.  *If you need a refill on your cardiac medications before your next appointment, please call your pharmacy*   Follow-Up: At [facility], you and your health needs are our priority.  As part of our continuing mission to provide you with exceptional heart care, our providers are all part of one team.  This team includes your primary Cardiologist (physician) and Advanced Practice Providers or APPs (Physician Assistants and Nurse Practitioners) who all work together to provide you with the care you need, when you need it.  Your next appointment:   1 year(s)  Provider:   Dr. Ladona (Provider Switch)
